# Patient Record
Sex: Male | Born: 1995 | Race: White | Hispanic: No | Marital: Married | Smoking: Never smoker
Health system: Southern US, Community
[De-identification: ages and names within clinical notes are randomized; demographics above are authoritative.]

## PROBLEM LIST (undated history)

## (undated) DIAGNOSIS — F32A Depression, unspecified: Secondary | ICD-10-CM

## (undated) DIAGNOSIS — F419 Anxiety disorder, unspecified: Secondary | ICD-10-CM

## (undated) DIAGNOSIS — F329 Major depressive disorder, single episode, unspecified: Secondary | ICD-10-CM

## (undated) HISTORY — DX: Major depressive disorder, single episode, unspecified: F32.9

## (undated) HISTORY — DX: Depression, unspecified: F32.A

## (undated) HISTORY — DX: Anxiety disorder, unspecified: F41.9

## (undated) HISTORY — PX: ELBOW CLOSED REDUCTION W/ PERCUANEOUS PINNING: SHX1492

---

## 2017-05-18 ENCOUNTER — Emergency Department (HOSPITAL_COMMUNITY): Payer: Managed Care, Other (non HMO)

## 2017-05-18 ENCOUNTER — Emergency Department (HOSPITAL_COMMUNITY)
Admission: EM | Admit: 2017-05-18 | Discharge: 2017-05-19 | Disposition: A | Discharge status: Home / Self Care | Payer: Managed Care, Other (non HMO) | Attending: Emergency Medicine | Admitting: Emergency Medicine

## 2017-05-18 ENCOUNTER — Encounter (HOSPITAL_COMMUNITY): Payer: Self-pay | Admitting: Emergency Medicine

## 2017-05-18 ENCOUNTER — Other Ambulatory Visit: Payer: Self-pay

## 2017-05-18 DIAGNOSIS — R112 Nausea with vomiting, unspecified: Secondary | ICD-10-CM | POA: Insufficient documentation

## 2017-05-18 DIAGNOSIS — M791 Myalgia, unspecified site: Secondary | ICD-10-CM | POA: Diagnosis present

## 2017-05-18 DIAGNOSIS — J111 Influenza due to unidentified influenza virus with other respiratory manifestations: Secondary | ICD-10-CM

## 2017-05-18 DIAGNOSIS — J3489 Other specified disorders of nose and nasal sinuses: Secondary | ICD-10-CM | POA: Insufficient documentation

## 2017-05-18 DIAGNOSIS — F17228 Nicotine dependence, chewing tobacco, with other nicotine-induced disorders: Secondary | ICD-10-CM | POA: Diagnosis not present

## 2017-05-18 DIAGNOSIS — R509 Fever, unspecified: Secondary | ICD-10-CM | POA: Insufficient documentation

## 2017-05-18 DIAGNOSIS — R0981 Nasal congestion: Secondary | ICD-10-CM | POA: Diagnosis not present

## 2017-05-18 DIAGNOSIS — R52 Pain, unspecified: Secondary | ICD-10-CM | POA: Diagnosis not present

## 2017-05-18 DIAGNOSIS — R69 Illness, unspecified: Secondary | ICD-10-CM

## 2017-05-18 LAB — CBC WITH DIFFERENTIAL/PLATELET
BASOS PCT: 0 %
Basophils Absolute: 0 10*3/uL (ref 0.0–0.1)
EOS ABS: 0.1 10*3/uL (ref 0.0–0.7)
EOS PCT: 0 %
HCT: 40.1 % (ref 39.0–52.0)
Hemoglobin: 13.6 g/dL (ref 13.0–17.0)
Lymphocytes Relative: 3 %
Lymphs Abs: 0.6 10*3/uL — ABNORMAL LOW (ref 0.7–4.0)
MCH: 29.3 pg (ref 26.0–34.0)
MCHC: 33.9 g/dL (ref 30.0–36.0)
MCV: 86.4 fL (ref 78.0–100.0)
MONO ABS: 1.7 10*3/uL — AB (ref 0.1–1.0)
MONOS PCT: 8 %
Neutro Abs: 18.8 10*3/uL — ABNORMAL HIGH (ref 1.7–7.7)
Neutrophils Relative %: 89 %
PLATELETS: 336 10*3/uL (ref 150–400)
RBC: 4.64 MIL/uL (ref 4.22–5.81)
RDW: 13 % (ref 11.5–15.5)
WBC: 21.3 10*3/uL — ABNORMAL HIGH (ref 4.0–10.5)

## 2017-05-18 LAB — COMPREHENSIVE METABOLIC PANEL
ALBUMIN: 4.5 g/dL (ref 3.5–5.0)
ALK PHOS: 74 U/L (ref 38–126)
ALT: 44 U/L (ref 17–63)
AST: 32 U/L (ref 15–41)
Anion gap: 13 (ref 5–15)
BILIRUBIN TOTAL: 1.3 mg/dL — AB (ref 0.3–1.2)
BUN: 9 mg/dL (ref 6–20)
CALCIUM: 9.7 mg/dL (ref 8.9–10.3)
CO2: 21 mmol/L — AB (ref 22–32)
CREATININE: 0.94 mg/dL (ref 0.61–1.24)
Chloride: 100 mmol/L — ABNORMAL LOW (ref 101–111)
GFR calc Af Amer: >60 mL/min (ref >60)
GFR calc non Af Amer: >60 mL/min (ref >60)
GLUCOSE: 98 mg/dL (ref 65–99)
Potassium: 3.7 mmol/L (ref 3.5–5.1)
SODIUM: 134 mmol/L — AB (ref 135–145)
Total Protein: 8.1 g/dL (ref 6.5–8.1)

## 2017-05-18 LAB — URINALYSIS, ROUTINE W REFLEX MICROSCOPIC
BILIRUBIN URINE: NEGATIVE
Glucose, UA: NEGATIVE mg/dL
Hgb urine dipstick: NEGATIVE
KETONES UR: 5 mg/dL — AB
Leukocytes, UA: NEGATIVE
NITRITE: NEGATIVE
PH: 7 (ref 5.0–8.0)
Protein, ur: NEGATIVE mg/dL
Specific Gravity, Urine: 1.021 (ref 1.005–1.030)

## 2017-05-18 MED ORDER — DIPHENHYDRAMINE HCL 50 MG/ML IJ SOLN
25.0000 mg | Freq: Once | INTRAMUSCULAR | Status: AC
  Administered 2017-05-18: 25 mg via INTRAVENOUS
  Filled 2017-05-18: qty 1

## 2017-05-18 MED ORDER — KETOROLAC TROMETHAMINE 30 MG/ML IJ SOLN
30.0000 mg | Freq: Once | INTRAMUSCULAR | Status: AC
  Administered 2017-05-18: 30 mg via INTRAVENOUS
  Filled 2017-05-18: qty 1

## 2017-05-18 MED ORDER — OXYMETAZOLINE HCL 0.05 % NA SOLN
1.0000 | Freq: Once | NASAL | Status: AC
  Administered 2017-05-18: 1 via NASAL
  Filled 2017-05-18: qty 15

## 2017-05-18 MED ORDER — PROCHLORPERAZINE EDISYLATE 5 MG/ML IJ SOLN
10.0000 mg | Freq: Once | INTRAMUSCULAR | Status: AC
  Administered 2017-05-18: 10 mg via INTRAVENOUS
  Filled 2017-05-18: qty 2

## 2017-05-18 MED ORDER — SODIUM CHLORIDE 0.9 % IV BOLUS (SEPSIS)
1000.0000 mL | Freq: Once | INTRAVENOUS | Status: AC
  Administered 2017-05-18: 1000 mL via INTRAVENOUS

## 2017-05-18 MED ORDER — SODIUM CHLORIDE 0.9 % IV BOLUS (SEPSIS)
1000.0000 mL | Freq: Once | INTRAVENOUS | Status: AC
  Administered 2017-05-19: 1000 mL via INTRAVENOUS

## 2017-05-18 NOTE — ED Provider Notes (Signed)
[facility] [id number] [facility] EMERGENCY DEPARTMENT Provider Note   CSN: [id number] Arrival date & time: 05/18/17  1928     History   Chief Complaint Chief Complaint  Patient presents with  . Influenza    HPI Kevin Lewis is a 22 y.o. male with no past medical history is here for evaluation of "flulike symptoms". Symptoms were sudden this morning. Symptoms include generalized, constant, sharp body pains, fevers, chills, nausea, 3 episodes of emesis with blood streaks in it, nasal congestion, clear rhinorrhea, sore throat, headache. Went to student clinic and tested negative for influenza. States some of his friends are having cold but nothing this severe. Took one Tylenol PTA which helped with fever. Has not been able to eat anything or drink anything today to see nausea and vomiting. Denies previous history of gastritis, PUD, heavy EtOH or NSAID use. No frank abdominal pain, states everything hurts. No urinary symptoms, diarrhea, constipation. No generalized rash or neck stiffness. No history of immunosuppression.  HPI  History reviewed. No pertinent past medical history.  There are no active problems to display for this patient.   Past Surgical History:  Procedure Laterality Date  . ELBOW CLOSED REDUCTION W/ PERCUANEOUS PINNING         Home Medications    Prior to Admission medications   Medication Sig Start Date End Date Taking? Authorizing Provider  ondansetron (ZOFRAN ODT) 4 MG disintegrating tablet Take 1 tablet (4 mg total) by mouth every 8 (eight) hours as needed for nausea or vomiting. 05/19/17   Liberty Handy, PA-C    Family History History reviewed. No pertinent family history.  Social History Social History   Tobacco Use  . Smoking status: Never Smoker  . Smokeless tobacco: Current User    Types: Snuff  Substance Use Topics  . Alcohol use: No    Frequency: Never  . Drug use: No     Allergies   Penicillins   Review of Systems Review of  Systems  Constitutional: Positive for chills and fever.  HENT: Positive for congestion, postnasal drip, rhinorrhea and sore throat.   Gastrointestinal: Positive for nausea and vomiting.  Musculoskeletal: Positive for myalgias.  All other systems reviewed and are negative.    Physical Exam Updated Vital Signs BP 114/65   Pulse 99   Temp 98.3 F (36.8 C) (Oral)   Resp 18   Ht 6' (1.829 m)   Wt 108.9 kg (240 lb)   SpO2 100%   BMI 32.55 kg/m   Physical Exam  Constitutional: He is oriented to person, place, and time. He appears well-developed and well-nourished. No distress.  Looks uncomfortable. Writhing in bed due to "body pains".   HENT:  Head: Normocephalic and atraumatic.  Nose: Mucosal edema and rhinorrhea present.  Mouth/Throat: Posterior oropharyngeal edema and posterior oropharyngeal erythema present. No oropharyngeal exudate. Tonsils are 2+ on the right. Tonsils are 2+ on the left.  Moderate mucosal edema with heavy clear rhinorrhea bilaterally. 2+, symmetric tonsillar hypertrophy without exudates. Oropharynx and tonsils are erythematous. Uvula is midline. No trismus.  Eyes: Conjunctivae and EOM are normal. Pupils are equal, round, and reactive to light.  Neck: Normal range of motion.  Bilateral submandibular  lymphadenopathy. Neck is supple. Trachea midline. No neck rigidity.   Cardiovascular: Normal rate, regular rhythm, normal heart sounds and intact distal pulses.  No murmur heard. 2+ DP and radial pulses bilaterally. No LE edema.   Pulmonary/Chest: Effort normal and breath sounds normal.  Abdominal: Soft. Bowel sounds are  normal. There is generalized tenderness.  Generalized, non focal abdominal tenderness patient states "not worse than these body pains". No G/R/R. No suprapubic or CVA tenderness.   Musculoskeletal: Normal range of motion. He exhibits no deformity.  Neurological: He is alert and oriented to person, place, and time.  Skin: Skin is warm and dry.  Capillary refill takes less than 2 seconds.  Psychiatric: He has a normal mood and affect. His behavior is normal. Judgment and thought content normal.  Nursing note and vitals reviewed.    ED Treatments / Results  Labs (all labs ordered are listed, but only abnormal results are displayed) Labs Reviewed  COMPREHENSIVE METABOLIC PANEL - Abnormal; Notable for the following components:      Result Value   Sodium 134 (*)    Chloride 100 (*)    CO2 21 (*)    Total Bilirubin 1.3 (*)    All other components within normal limits  CBC WITH DIFFERENTIAL/PLATELET - Abnormal; Notable for the following components:   WBC 21.3 (*)    Neutro Abs 18.8 (*)    Lymphs Abs 0.6 (*)    Monocytes Absolute 1.7 (*)    All other components within normal limits  URINALYSIS, ROUTINE W REFLEX MICROSCOPIC - Abnormal; Notable for the following components:   Ketones, ur 5 (*)    All other components within normal limits    EKG  EKG Interpretation None       Radiology Dg Chest 2 View  Result Date: 05/18/2017 CLINICAL DATA:  Fever, chills, body aches, nausea, and vomiting. EXAM: CHEST  2 VIEW COMPARISON:  None. FINDINGS: The cardiomediastinal silhouette is within normal limits. The lungs are well inflated and clear. There is no evidence of pleural effusion or pneumothorax. No acute osseous abnormality is identified. IMPRESSION: No active cardiopulmonary disease. Electronically Signed   By: Sebastian AcheAllen  Grady M.D.   On: 05/18/2017 20:23    Procedures Procedures (including critical care time)  Medications Ordered in ED Medications  sodium chloride 0.9 % bolus 1,000 mL (1,000 mLs Intravenous New Bag/Given 05/19/17 0000)  ketorolac (TORADOL) 30 MG/ML injection 30 mg (30 mg Intravenous Given 05/18/17 2355)  prochlorperazine (COMPAZINE) injection 10 mg (10 mg Intravenous Given 05/18/17 2355)  diphenhydrAMINE (BENADRYL) injection 25 mg (25 mg Intravenous Given 05/18/17 2355)  oxymetazoline (AFRIN) 0.05 % nasal spray 1  spray (1 spray Each Nare Given 05/18/17 2355)  sodium chloride 0.9 % bolus 1,000 mL (1,000 mLs Intravenous New Bag/Given 05/18/17 2355)     Initial Impression / Assessment and Plan / ED Course  I have reviewed the triage vital signs and the nursing notes.  Pertinent labs & imaging results that were available during my care of the patient were reviewed by me and considered in my medical decision making (see chart for details).  Clinical Course as of May 19 110  Fri May 18, 2017  2318 WBC: (!) 21.3 [CG]  Sat May 19, 2017  0007 Pt just administered medicines. Will reassess. Discussed benign work up with parents at bedside.   [CG]  [id number] Reevaluated patient. States he feels "much better". No episodes of emesis or diarrhea in the emergency department. Vital signs have remained within normal limits. We'll attempt fluid challenge and finish IV fluids. Anticipate discharge. He is agreeable with this.  [CG]    Clinical Course User Index [CG] Liberty HandyGibbons, Claudia J, PA-C   22 yo here for symptoms consistent with viral syndrome. Initial VS WNL. He is writing in pain, screaming from body  pains in the room. Otherwise reassuring, diffuse body pain and non focal abdominal soreness. No localized TTP to Mur[place], Mcburney's, suprapubic or CVAT areas. He has leukocytosis 21.3, but labs reassuring. No indication for further emergent lab work. Doubt intraabdominal etiology in this patient. He was given IVF and migraine cocktail. Re-evaluated pt and he felt much better. No episodes of emesis or diarrhea. Repeat abdominal exam reassuring. He is sitting up in bed and talking to friend. Will attempt PO challenge and finish IVF. Anticipate d/c.Discussed return precautions. Pt agreeable. Of note he tested negative for influenza at school clinic. He is otherwise immunocompetent and anticipate he will improve with symptom control as outpatient.   Final Clinical Impressions(s) / ED Diagnoses   Final diagnoses:  Influenza-like  illness  Body aches    ED Discharge Orders        Ordered    ondansetron (ZOFRAN ODT) 4 MG disintegrating tablet  Every 8 hours PRN     05/19/17 0057       Liberty Handy, PA-C 05/19/17 0112    Dione Booze, MD 05/19/17 [phone]

## 2017-05-18 NOTE — ED Triage Notes (Signed)
Pt having flu like symptoms since yesterday with fever, chills, generalize body ache nausea and vomiting.

## 2017-05-19 MED ORDER — ONDANSETRON 4 MG PO TBDP: 4.0000 mg | ORAL_TABLET | Freq: Three times a day (TID) | ORAL | 0 refills | Status: DC | PRN

## 2017-05-19 NOTE — Discharge Instructions (Signed)
Your symptoms are most likely from a virus. Usually symptoms are sudden but resolve over the next 24-48 hours. Her white blood cell count was elevated today but your labs and urine were otherwise normal. The main treatment for viral illnesses are supportive. Stay well-hydrated. Take Zofran for nausea. Tylenol or ibuprofen will help with generalized body pains and headaches. Over-the-counter nasal decongestions or nasal sprays also will help with nasal congestion and runny nose which can worsen headaches.

## 2017-07-10 ENCOUNTER — Encounter (HOSPITAL_COMMUNITY): Payer: Self-pay | Admitting: Emergency Medicine

## 2017-07-10 ENCOUNTER — Ambulatory Visit (INDEPENDENT_AMBULATORY_CARE_PROVIDER_SITE_OTHER): Payer: Managed Care, Other (non HMO)

## 2017-07-10 ENCOUNTER — Ambulatory Visit (HOSPITAL_COMMUNITY)
Admission: EM | Admit: 2017-07-10 | Discharge: 2017-07-10 | Disposition: A | Discharge status: Home / Self Care | Payer: Managed Care, Other (non HMO) | Attending: Family Medicine | Admitting: Family Medicine

## 2017-07-10 ENCOUNTER — Other Ambulatory Visit: Payer: Self-pay

## 2017-07-10 DIAGNOSIS — S9032XA Contusion of left foot, initial encounter: Secondary | ICD-10-CM

## 2017-07-10 DIAGNOSIS — S8002XA Contusion of left knee, initial encounter: Secondary | ICD-10-CM | POA: Diagnosis not present
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
## 2017-07-10 NOTE — Discharge Instructions (Signed)
You may use over the counter ibuprofen or acetaminophen as needed.  ° °

## 2018-05-29 ENCOUNTER — Encounter (HOSPITAL_COMMUNITY): Payer: Self-pay | Admitting: Emergency Medicine

## 2018-05-29 ENCOUNTER — Ambulatory Visit (HOSPITAL_COMMUNITY)
Admission: EM | Admit: 2018-05-29 | Discharge: 2018-05-29 | Disposition: A | Discharge status: Home / Self Care | Payer: BLUE CROSS/BLUE SHIELD | Attending: Family Medicine | Admitting: Family Medicine

## 2018-05-29 DIAGNOSIS — M5442 Lumbago with sciatica, left side: Secondary | ICD-10-CM

## 2018-05-29 MED ORDER — CYCLOBENZAPRINE HCL 10 MG PO TABS: 10.0000 mg | ORAL_TABLET | Freq: Every day | ORAL | 0 refills | Status: DC

## 2018-05-29 MED ORDER — NAPROXEN 500 MG PO TABS: 500.0000 mg | ORAL_TABLET | Freq: Two times a day (BID) | ORAL | 0 refills | Status: DC

## 2018-05-29 NOTE — ED Triage Notes (Signed)
Pt sts lower back pain with radiation down left leg

## 2018-05-29 NOTE — Discharge Instructions (Signed)
Continue conservative management of rest, ice, heat, and gentle stretches Take naproxen as needed for pain relief (may cause abdominal discomfort, ulcers, and GI bleeds avoid taking with other NSAIDs) Take cyclobenzaprine at nighttime for symptomatic relief. Avoid driving or operating heavy machinery while using medication. Follow up with PCP, [facility], or with orthopedist if symptoms persist Return or go to the ER if you have any new or worsening symptoms (fever, chills, chest pain, abdominal pain, changes in bowel or bladder habits, etc...)

## 2018-05-29 NOTE — ED Provider Notes (Signed)
[facility]ARE [facility]ENTER   [id number] 05/29/18 Arrival Time: 1600  CC: Back PAIN  SUBJECTIVE: History from: patient. Kevin Lewis is a 23 y.o. male complains of low back pain that began 1 month ago.  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  Localizes the pain to the left low back and down left leg.  Describes the pain as constant and dull/ throbbing in character.  Has tried OTC medications without relief.  Symptoms are made worse with bending forward.  Denies similar symptoms in the past.  Denies fever, chills, erythema, ecchymosis, effusion, weakness, numbness and tingling, saddle paresthesias, loss of bowel or bladder function.    ROS: As per HPI.  History reviewed. No pertinent past medical history. Past Surgical History:  Procedure Laterality Date  . ELBOW CLOSED REDUCTION W/ PERCUANEOUS PINNING     Allergies  Allergen Reactions  . Penicillins    No current facility-administered medications on file prior to encounter.    Current Outpatient Medications on File Prior to Encounter  Medication Sig Dispense Refill  . ondansetron (ZOFRAN ODT) 4 MG disintegrating tablet Take 1 tablet (4 mg total) by mouth every 8 (eight) hours as needed for nausea or vomiting. 20 tablet 0   Social History   Socioeconomic History  . Marital status: Single    Spouse name: Not on file  . Number of children: Not on file  . Years of education: Not on file  . Highest education level: Not on file  Occupational History  . Not on file  Social Needs  . Financial resource strain: Not on file  . Food insecurity:    Worry: Not on file    Inability: Not on file  . Transportation needs:    Medical: Not on file    Non-medical: Not on file  Tobacco Use  . Smoking status: Never Smoker  . Smokeless tobacco: Current User    Types: Snuff  Substance and Sexual Activity  . Alcohol use: No    Frequency: Never  . Drug use: No  . Sexual activity: Not on file  Lifestyle  . Physical activity:     Days per week: Not on file    Minutes per session: Not on file  . Stress: Not on file  Relationships  . Social connections:    Talks on phone: Not on file    Gets together: Not on file    Attends religious service: Not on file    Active member of club or organization: Not on file    Attends meetings of clubs or organizations: Not on file    Relationship status: Not on file  . Intimate partner violence:    Fear of current or ex partner: Not on file    Emotionally abused: Not on file    Physically abused: Not on file    Forced sexual activity: Not on file  Other Topics Concern  . Not on file  Social History Narrative  . Not on file   Family History  Problem Relation Age of Onset  . Diabetes Mother   . Diabetes Father     OBJECTIVE:  Vitals:   05/29/18 1634  BP: 118/65  Pulse: 85  Resp: 18  Temp: 98 F (36.7 C)  TempSrc: Oral  SpO2: 97%    General appearance: Alert; in no acute distress.  Head: NCAT Lungs: CTA bilaterally Heart: RRR.   Musculoskeletal: Back Inspection: Skin warm, dry, clear and intact without obvious erythema, effusion, or ecchymosis.  Palpation: TTP over LT low back; no midline tenderness ROM: FROM active and passive Strength: 5/5 shld abduction, 5/5 shld adduction, 5/5 elbow flexion, 5/5 elbow extension, 5/5 grip strength, 5/5 hip flexion, 5/5 knee abduction, 5/5 knee adduction, 5/5 knee flexion, 5/5 knee extension, 5/5 dorsiflexion, 5/5 plantar flexion Skin: warm and dry Neurologic: Ambulates without difficulty; Sensation intact about the upper/ lower extremities; finger to nose without difficulty; negative pronator drift Psychological: alert and cooperative; normal mood and affect  ASSESSMENT & PLAN:  1. Acute left-sided low back pain with left-sided sciatica     Meds ordered this encounter  Medications  . naproxen (NAPROSYN) 500 MG tablet    Sig: Take 1 tablet (500 mg total) by mouth 2 (two) times daily.    Dispense:  30 tablet     Refill:  0    Order Specific Question:   Supervising Provider    Answer:   Eustace Moore [[id number]]  . cyclobenzaprine (FLEXERIL) 10 MG tablet    Sig: Take 1 tablet (10 mg total) by mouth at bedtime.    Dispense:  12 tablet    Refill:  0    Order Specific Question:   Supervising Provider    Answer:   Eustace Moore [[id number]]    Continue conservative management of rest, ice, heat, and gentle stretches Take naproxen as needed for pain relief (may cause abdominal discomfort, ulcers, and GI bleeds avoid taking with other NSAIDs) Take cyclobenzaprine at nighttime for symptomatic relief. Avoid driving or operating heavy machinery while using medication. Follow up with PCP, [facility], or with orthopedist if symptoms persist Return or go to the ER if you have any new or worsening symptoms (fever, chills, chest pain, abdominal pain, changes in bowel or bladder habits, etc...)   [place] Controlled Substances Registry consulted for this patient. I feel the risk/benefit ratio today is favorable for proceeding with this prescription for a controlled substance. Medication sedation precautions given.  Reviewed expectations re: course of current medical issues. Questions answered. Outlined signs and symptoms indicating need for more acute intervention. Patient verbalized understanding. After Visit Summary given.    Rennis Harding, PA-C 05/29/18 1709

## 2018-06-02 ENCOUNTER — Encounter (HOSPITAL_COMMUNITY): Payer: Self-pay

## 2018-06-02 ENCOUNTER — Ambulatory Visit (HOSPITAL_COMMUNITY)
Admission: EM | Admit: 2018-06-02 | Discharge: 2018-06-02 | Disposition: A | Discharge status: Home / Self Care | Payer: BLUE CROSS/BLUE SHIELD | Attending: Family Medicine | Admitting: Family Medicine

## 2018-06-02 ENCOUNTER — Ambulatory Visit (INDEPENDENT_AMBULATORY_CARE_PROVIDER_SITE_OTHER): Payer: BLUE CROSS/BLUE SHIELD

## 2018-06-02 ENCOUNTER — Other Ambulatory Visit: Payer: Self-pay

## 2018-06-02 DIAGNOSIS — M5442 Lumbago with sciatica, left side: Secondary | ICD-10-CM

## 2018-06-02 DIAGNOSIS — M545 Low back pain: Secondary | ICD-10-CM

## 2018-06-02 MED ORDER — HYDROCODONE-ACETAMINOPHEN 7.5-325 MG PO TABS: 1.0000 | ORAL_TABLET | ORAL | 0 refills | Status: DC | PRN

## 2018-06-02 MED ORDER — METHYLPREDNISOLONE 4 MG PO TBPK: ORAL_TABLET | ORAL | 0 refills | Status: DC

## 2018-06-02 MED ORDER — CITALOPRAM HYDROBROMIDE 20 MG PO TABS: 20.0000 mg | ORAL_TABLET | Freq: Every day | ORAL | 1 refills | Status: DC

## 2018-06-02 NOTE — ED Provider Notes (Signed)
[facility]    CSN: [id number] Arrival date & time: 06/02/18  1258     History   Chief Complaint Chief Complaint  Patient presents with  . Leg Pain    HPI Kevin Lewis is a 23 y.o. male.   HPI  Patient is here for back pain.  He states he has had gradual onset of back pain for about a month.  For the first week it was just an achiness in his low back.  Then he developed sudden pain down his left leg.  The pain is getting worse.  He states sometimes when he puts weight on the leg it gives way secondary to pain.  No real numbness or weakness.  No bowel or bladder complaints.  No injury.  No known spine condition.  No prior back history.  He thinks it is from his work activity but does not have a specific work injury. He was seen last week.  Exam was consistent with left sciatica.  He was given naproxen 500 twice daily and Flexeril 10 mg.  He states he is unable to work.  He states these have not given him any improvement in his pain.  History reviewed. No pertinent past medical history.  There are no active problems to display for this patient.   Past Surgical History:  Procedure Laterality Date  . ELBOW CLOSED REDUCTION W/ PERCUANEOUS PINNING         Home Medications    Prior to Admission medications   Medication Sig Start Date End Date Taking? Authorizing Provider  citalopram (CELEXA) 20 MG tablet Take 1 tablet (20 mg total) by mouth daily. 06/02/18   Eustace Moore, MD  cyclobenzaprine (FLEXERIL) 10 MG tablet Take 1 tablet (10 mg total) by mouth at bedtime. 05/29/18   Wurst, [place], PA-C  HYDROcodone-acetaminophen (NORCO) 7.5-325 MG tablet Take 1 tablet by mouth every 4 (four) hours as needed for moderate pain. 06/02/18   Eustace Moore, MD  methylPREDNISolone (MEDROL DOSEPAK) 4 MG TBPK tablet tad 06/02/18   Eustace Moore, MD  naproxen (NAPROSYN) 500 MG tablet Take 1 tablet (500 mg total) by mouth 2 (two) times daily. 05/29/18   Wurst, [place], PA-C    ondansetron (ZOFRAN ODT) 4 MG disintegrating tablet Take 1 tablet (4 mg total) by mouth every 8 (eight) hours as needed for nausea or vomiting. 05/19/17   Liberty Handy, PA-C    Family History Family History  Problem Relation Age of Onset  . Diabetes Mother   . Diabetes Father   Mother had spine surgery in her 50s  Social History Social History   Tobacco Use  . Smoking status: Never Smoker  . Smokeless tobacco: Current User    Types: Snuff  Substance Use Topics  . Alcohol use: No    Frequency: Never  . Drug use: No     Allergies   Penicillins   Review of Systems Review of Systems  Constitutional: Negative for chills and fever.  HENT: Negative for ear pain and sore throat.   Eyes: Negative for pain and visual disturbance.  Respiratory: Negative for cough and shortness of breath.   Cardiovascular: Negative for chest pain and palpitations.  Gastrointestinal: Negative for abdominal pain and vomiting.  Genitourinary: Negative for dysuria and hematuria.  Musculoskeletal: Positive for back pain and gait problem. Negative for arthralgias.  Skin: Negative for color change and rash.  Neurological: Negative for seizures and syncope.  All other systems reviewed and are negative.  Physical Exam Triage Vital Signs ED Triage Vitals  Enc Vitals Group     BP 06/02/18 1313 124/74     Pulse Rate 06/02/18 1313 74     Resp 06/02/18 1313 18     Temp 06/02/18 1313 97.6 F (36.4 C)     Temp Source 06/02/18 1313 Tympanic     SpO2 06/02/18 1313 100 %     Weight 06/02/18 1315 240 lb (108.9 kg)     Height --      Head Circumference --      Peak Flow --      Pain Score 06/02/18 1315 10     Pain Loc --      Pain Edu? --      Excl. in GC? --    No data found.  Updated Vital Signs BP 124/74 (BP Location: Right Arm)   Pulse 74   Temp 97.6 F (36.4 C) (Tympanic)   Resp 18   Wt 108.9 kg   SpO2 100%   BMI 32.55 kg/m      Physical Exam Constitutional:      General:  He is not in acute distress.    Appearance: He is well-developed. He is obese.     Comments: Overweight.  Appears acutely uncomfortable.  Paces around room  HENT:     Head: Normocephalic and atraumatic.  Eyes:     Conjunctiva/sclera: Conjunctivae normal.     Pupils: Pupils are equal, round, and reactive to light.  Neck:     Musculoskeletal: Normal range of motion.  Cardiovascular:     Rate and Rhythm: Normal rate.  Pulmonary:     Effort: Pulmonary effort is normal. No respiratory distress.  Abdominal:     General: There is no distension.     Palpations: Abdomen is soft.  Musculoskeletal: Normal range of motion.     Comments: Back is straight and symmetric.  Mild tenderness centrally L5-S1 junction.  No palpable muscle spasm.  Neck range of motion is full.  No pain with axial loading.  No pain with passive rotation.  Very limited range of motion with patient however only to forward flex only 15 degrees.  Reflexes are 2+ and equal at the knee.  Reflexes 2+ right ankle, 1+ left ankle.  No weakness.  Can stand on heels and toes without difficulty.  No numbness noted.  Straight leg raise is positive for left leg pain at 30 degrees on the left, and positive for left leg and buttock pain at 45 degrees on the right  Skin:    General: Skin is warm and dry.  Neurological:     Mental Status: He is alert.     Sensory: No sensory deficit.     Motor: No weakness.     Gait: Gait normal.     Deep Tendon Reflexes: Reflexes abnormal.  Psychiatric:        Mood and Affect: Mood normal.        Behavior: Behavior normal.      UC Treatments / Results  Labs (all labs ordered are listed, but only abnormal results are displayed) Labs Reviewed - No data to display  EKG None  Radiology Dg Lumbar Spine Complete  Result Date: 06/02/2018 CLINICAL DATA:  Acute low back pain radiating into LEFT leg. No known injury. Initial encounter. EXAM: LUMBAR SPINE - COMPLETE 4+ VIEW COMPARISON:  None. FINDINGS:  There is no evidence of lumbar spine fracture. Alignment is normal. Intervertebral disc spaces are maintained. IMPRESSION:  Negative. Electronically Signed   By: Harmon Pier M.D.   On: 06/02/2018 14:35    Procedures Procedures (including critical care time)  Medications Ordered in UC Medications - No data to display  Initial Impression / Assessment and Plan / UC Course  I have reviewed the triage vital signs and the nursing notes.  Pertinent labs & imaging results that were available during my care of the patient were reviewed by me and considered in my medical decision making (see chart for details).     On physical exam there are no indicators of symptom magnification.  He does have positive straight leg raising bilaterally which is suspicious for disc disease/neural impingement.  X-rays are done because of the neurologic finding of diminished reflex on the left.  He will possibly need referral to a spine specialist.  I have given him the number for PCP as well.  The PCP can keep him on his nonsteroidal medicine, anti-inflammatories, and perhaps engage him in physical therapy. Final Clinical Impressions(s) / UC Diagnoses   Final diagnoses:  Acute left-sided low back pain with left-sided sciatica     Discharge Instructions     Activity as tolerated Take the Medrol (steroid) as directed Take all of day 1 today Take the cyclobenzaprine muscle relaxer at bedtime Take hydrocodone as needed for severe pain.  Caution drowsiness.  Do not drive on this medication.  This medicine can cause constipation, make sure you are drinking enough fluids and eating a high-fiber diet You need to follow-up with your primary care doctor or a spine specialist for additional testing    ED Prescriptions    Medication Sig Dispense Auth. Provider   methylPREDNISolone (MEDROL DOSEPAK) 4 MG TBPK tablet tad 21 tablet Eustace Moore, MD   HYDROcodone-acetaminophen (NORCO) 7.5-325 MG tablet Take 1 tablet by  mouth every 4 (four) hours as needed for moderate pain. 20 tablet Eustace Moore, MD   citalopram (CELEXA) 20 MG tablet Take 1 tablet (20 mg total) by mouth daily. 30 tablet Eustace Moore, MD     Controlled Substance Prescriptions [place] Controlled Substance Registry consulted? Not Applicable   Eustace Moore, MD 06/02/18 [phone]

## 2018-06-02 NOTE — Discharge Instructions (Signed)
Activity as tolerated Take the Medrol (steroid) as directed Take all of day 1 today Take the cyclobenzaprine muscle relaxer at bedtime Take hydrocodone as needed for severe pain.  Caution drowsiness.  Do not drive on this medication.  This medicine can cause constipation, make sure you are drinking enough fluids and eating a high-fiber diet You need to follow-up with your primary care doctor or a spine specialist for additional testing

## 2018-06-02 NOTE — ED Triage Notes (Signed)
Pt was here for left leg pain on Wednseday of this week. Pt states the pain is getting worst. Pt states the meds are not working.

## 2018-06-13 DIAGNOSIS — M5416 Radiculopathy, lumbar region: Secondary | ICD-10-CM | POA: Diagnosis not present

## 2018-06-20 DIAGNOSIS — M5416 Radiculopathy, lumbar region: Secondary | ICD-10-CM | POA: Diagnosis not present

## 2018-06-20 DIAGNOSIS — M545 Low back pain: Secondary | ICD-10-CM | POA: Diagnosis not present

## 2018-06-26 ENCOUNTER — Ambulatory Visit (INDEPENDENT_AMBULATORY_CARE_PROVIDER_SITE_OTHER): Payer: BLUE CROSS/BLUE SHIELD | Admitting: Family Medicine

## 2018-06-26 ENCOUNTER — Ambulatory Visit: Payer: BLUE CROSS/BLUE SHIELD | Admitting: Family Medicine

## 2018-06-26 ENCOUNTER — Encounter: Payer: Self-pay | Admitting: Family Medicine

## 2018-06-26 DIAGNOSIS — F325 Major depressive disorder, single episode, in full remission: Secondary | ICD-10-CM | POA: Insufficient documentation

## 2018-06-26 DIAGNOSIS — Z7689 Persons encountering health services in other specified circumstances: Secondary | ICD-10-CM | POA: Diagnosis not present

## 2018-06-26 MED ORDER — CITALOPRAM HYDROBROMIDE 20 MG PO TABS: 20.0000 mg | ORAL_TABLET | Freq: Every day | ORAL | 3 refills | Status: DC

## 2018-06-26 NOTE — Progress Notes (Signed)
Virtual Visit via Video Note  I connected with [name] on 06/26/18 at  4:00 PM EDT by a video enabled telemedicine application and verified that I am speaking with the correct person using two identifiers.   I discussed the limitations of evaluation and management by telemedicine and the availability of in person appointments. The patient expressed understanding and agreed to proceed.  History of Present Illness: Kevin Lewis is a 23 y.o. male looking to establish care with our office and to discuss antidepressant medication. He is a Consulting civil engineer at [employer]. He was seen at student health in 02/2018 and was started on celexa 20mg  daily. He saw a therapist "a few times" but wasn't sure it was helpful. This has ended as school is not currently in session due to COVID-19 pandemic. Pt states his symptoms and mood have improved on the medication. He denies any side effects. He would like to continue taking it and needs a refill. Denies SI and no h/o suicide attempts. He has not been treated for depression previously and has not previously taken anti-depressant medication. + fam h/o depression - both parents PHQ-9 = 4 GAD-7 = 6 No insomnia. Mild anxiety. Normal appetite.  No fever, chills. No GI symptoms. No CP, SOB, palpitations  Past Medical History:  Diagnosis Date  . Anxiety   . Depression     Past Surgical History:  Procedure Laterality Date  . ELBOW CLOSED REDUCTION W/ PERCUANEOUS PINNING     Social History   Socioeconomic History  . Marital status: Single    Spouse name: Not on file  . Number of children: Not on file  . Years of education: Not on file  . Highest education level: Not on file  Occupational History  . Not on file  Social Needs  . Financial resource strain: Not on file  . Food insecurity:    Worry: Not on file    Inability: Not on file  . Transportation needs:    Medical: Not on file    Non-medical: Not on file  Tobacco Use  . Smoking status: Never Smoker  .  Smokeless tobacco: Current User    Types: Snuff  Substance and Sexual Activity  . Alcohol use: No    Frequency: Never  . Drug use: No  . Sexual activity: Not on file  Lifestyle  . Physical activity:    Days per week: Not on file    Minutes per session: Not on file  . Stress: Not on file  Relationships  . Social connections:    Talks on phone: Not on file    Gets together: Not on file    Attends religious service: Not on file    Active member of club or organization: Not on file    Attends meetings of clubs or organizations: Not on file    Relationship status: Not on file  . Intimate partner violence:    Fear of current or ex partner: Not on file    Emotionally abused: Not on file    Physically abused: Not on file    Forced sexual activity: Not on file  Other Topics Concern  . Not on file  Social History Narrative  . Not on file   Family History  Problem Relation Age of Onset  . Diabetes Mother   . Diabetes Father    Current Outpatient Medications on File Prior to Visit  Medication Sig Dispense Refill  . citalopram (CELEXA) 20 MG tablet Take 1 tablet (20  mg total) by mouth daily. 30 tablet 1   No current facility-administered medications on file prior to visit.    Immunization History  Administered Date(s) Administered  . Influenza,inj,Quad PF,6+ Mos 01/11/2017, 12/25/2017   Allergies  Allergen Reactions  . Penicillins      Observations/Objective: Pt is alert, in no distress. He is well-developed.  Pt answers questions appropriately and mood is appropriate.   Assessment and Plan: 1. Encounter to establish care with new doctor 2. Depression, major, single episode, complete remission (HCC) - stable, very well-controlled - PHQ-9 = 4 Refill: - citalopram (CELEXA) 20 MG tablet; Take 1 tablet (20 mg total) by mouth daily.  Dispense: 90 tablet; Refill: 3 - f/u in 6-12 mo or sooner PRN    I discussed the assessment and treatment plan with the patient. The  patient was provided an opportunity to ask questions and all were answered. The patient agreed with the plan and demonstrated an understanding of the instructions.   The patient was advised to call back or seek an in-person evaluation if the symptoms worsen or if the condition fails to improve as anticipated.  I provided 20 minutes of non-face-to-face time during this encounter.   Luana Shu, DO

## 2018-07-09 DIAGNOSIS — M5126 Other intervertebral disc displacement, lumbar region: Secondary | ICD-10-CM | POA: Diagnosis not present

## 2018-11-18 DIAGNOSIS — Z20828 Contact with and (suspected) exposure to other viral communicable diseases: Secondary | ICD-10-CM | POA: Diagnosis not present

## 2018-12-22 DIAGNOSIS — R05 Cough: Secondary | ICD-10-CM | POA: Diagnosis not present

## 2019-01-08 DIAGNOSIS — J988 Other specified respiratory disorders: Secondary | ICD-10-CM | POA: Diagnosis not present

## 2019-03-11 DIAGNOSIS — Z03818 Encounter for observation for suspected exposure to other biological agents ruled out: Secondary | ICD-10-CM | POA: Diagnosis not present

## 2019-06-13 ENCOUNTER — Ambulatory Visit: Payer: BC Managed Care – PPO | Attending: Internal Medicine

## 2019-06-13 DIAGNOSIS — Z23 Encounter for immunization: Secondary | ICD-10-CM

## 2019-06-13 NOTE — Progress Notes (Signed)
   Covid-19 Vaccination Clinic  Name:  Kevin Lewis    MRN: [id number] DOB: 05/24/1995  06/13/2019  [name] was observed post Covid-19 immunization for 15 minutes without incident. He was provided with Vaccine Information Sheet and instruction to access the V-Safe system.   [name] was instructed to call 911 with any severe reactions post vaccine: Marland Kitchen Difficulty breathing  . Swelling of face and throat  . A fast heartbeat  . A bad rash all over body  . Dizziness and weakness   Immunizations Administered    Name Date Dose VIS Date Route   Pfizer COVID-19 Vaccine 06/13/2019  1:59 PM 0.3 mL 03/14/2019 Intramuscular   Manufacturer: [employer], [employer]   Lot: [id number]   NDC: [address]-7

## 2019-07-07 ENCOUNTER — Other Ambulatory Visit: Payer: Self-pay | Admitting: Family Medicine

## 2019-07-07 DIAGNOSIS — F325 Major depressive disorder, single episode, in full remission: Secondary | ICD-10-CM

## 2019-07-08 ENCOUNTER — Ambulatory Visit: Payer: BC Managed Care – PPO | Attending: Internal Medicine

## 2019-07-08 DIAGNOSIS — Z23 Encounter for immunization: Secondary | ICD-10-CM

## 2019-07-08 NOTE — Progress Notes (Signed)
   Covid-19 Vaccination Clinic  Name:  Kevin Lewis    MRN: [id number] DOB: Feb 05, 1996  07/08/2019  [name] was observed post Covid-19 immunization for 15 minutes without incident. He was provided with Vaccine Information Sheet and instruction to access the V-Safe system.   [name] was instructed to call 911 with any severe reactions post vaccine: Marland Kitchen Difficulty breathing  . Swelling of face and throat  . A fast heartbeat  . A bad rash all over body  . Dizziness and weakness   Immunizations Administered    Name Date Dose VIS Date Route   Pfizer COVID-19 Vaccine 07/08/2019  9:23 AM 0.3 mL 03/14/2019 Intramuscular   Manufacturer: [employer], [employer]   Lot: [id number]   NDC: [address]-3

## 2019-08-18 ENCOUNTER — Ambulatory Visit (HOSPITAL_COMMUNITY)
Admission: EM | Admit: 2019-08-18 | Discharge: 2019-08-18 | Disposition: A | Discharge status: Home / Self Care | Payer: BC Managed Care – PPO | Attending: Urgent Care | Admitting: Urgent Care

## 2019-08-18 ENCOUNTER — Other Ambulatory Visit: Payer: Self-pay

## 2019-08-18 ENCOUNTER — Encounter (HOSPITAL_COMMUNITY): Payer: Self-pay

## 2019-08-18 DIAGNOSIS — M7918 Myalgia, other site: Secondary | ICD-10-CM

## 2019-08-18 DIAGNOSIS — L03317 Cellulitis of buttock: Secondary | ICD-10-CM | POA: Diagnosis not present

## 2019-08-18 MED ORDER — MELOXICAM 15 MG PO TABS: 15.0000 mg | ORAL_TABLET | Freq: Every day | ORAL | 0 refills | Status: DC

## 2019-08-18 MED ORDER — DOXYCYCLINE HYCLATE 100 MG PO CAPS: 100.0000 mg | ORAL_CAPSULE | Freq: Two times a day (BID) | ORAL | 0 refills | Status: DC

## 2019-08-18 NOTE — ED Provider Notes (Signed)
[place]   MRN: [id number] DOB: 1995/12/12  Subjective:   Kevin Lewis is a 24 y.o. male presenting for 3 day hx of acute onset buttock pain right at the top of his tailbone.  Pain has worsened and he is having a difficult time sitting.  Has not used medications for pain relief.  Denies history of abscess, pilonidal cyst.  Denies fever, nausea, vomiting, belly pain, painful urination, hematuria.  No current facility-administered medications for this encounter.  Current Outpatient Medications:  .  citalopram (CELEXA) 20 MG tablet, Take 1qd (Plz sched appt for future fills), Disp: 90 tablet, Rfl: 0   Allergies  Allergen Reactions  . Penicillins     Past Medical History:  Diagnosis Date  . Anxiety   . Depression      Past Surgical History:  Procedure Laterality Date  . ELBOW CLOSED REDUCTION W/ PERCUANEOUS PINNING      Family History  Problem Relation Age of Onset  . Diabetes Mother   . Diabetes Father     Social History   Tobacco Use  . Smoking status: Never Smoker  . Smokeless tobacco: Current User    Types: Snuff  Substance Use Topics  . Alcohol use: Yes  . Drug use: No    ROS   Objective:   Vitals: BP 115/69 (BP Location: Right Arm)   Pulse 81   Temp 98.7 F (37.1 C) (Oral)   Resp 19   Wt 250 lb (113.4 kg)   SpO2 93%   BMI 33.91 kg/m   Physical Exam Constitutional:      General: He is not in acute distress.    Appearance: Normal appearance. He is well-developed. He is obese. He is not ill-appearing, toxic-appearing or diaphoretic.  HENT:     Head: Normocephalic and atraumatic.     Right Ear: External ear normal.     Left Ear: External ear normal.     Nose: Nose normal.     Mouth/Throat:     Pharynx: Oropharynx is clear.  Eyes:     General: No scleral icterus.       Right eye: No discharge.        Left eye: No discharge.     Extraocular Movements: Extraocular movements intact.     Pupils: Pupils are equal, round, and reactive  to light.  Cardiovascular:     Rate and Rhythm: Normal rate.  Pulmonary:     Effort: Pulmonary effort is normal.  Musculoskeletal:     Cervical back: Normal range of motion.  Skin:      Neurological:     Mental Status: He is alert and oriented to person, place, and time.  Psychiatric:        Mood and Affect: Mood normal.        Behavior: Behavior normal.        Thought Content: Thought content normal.        Judgment: Judgment normal.     Assessment and Plan :   PDMP not reviewed this encounter.  1. Cellulitis of buttock   2. Buttock pain     Will manage for cellulitis given that there was no central fluctuance.  Wound not amenable to I&D as such, patient also did not want I&D unless I was sure it was an abscess. Use meloxicam for pain and inflammation, doxycycline to address infectious process. Recommended 2 day window for f/u if sx persist or definitely if they worsen for recheck to see if an area  amenable to I&D develops. Counseled patient on potential for adverse effects with medications prescribed/recommended today, ER and return-to-clinic precautions discussed, patient verbalized understanding.    Wallis Bamberg, [place] 08/18/19 1657

## 2019-08-18 NOTE — ED Triage Notes (Signed)
Pt is here with an abscess on his tailbone that he notice Saturday. Pt has taken Advil to relieve discomfort.

## 2019-08-18 NOTE — Discharge Instructions (Signed)
If your symptoms persist or worsen then do not hesitate to come back as you may end up developing an abscess that we can lance.

## 2019-10-09 ENCOUNTER — Other Ambulatory Visit: Payer: Self-pay | Admitting: Family Medicine

## 2019-10-09 DIAGNOSIS — F325 Major depressive disorder, single episode, in full remission: Secondary | ICD-10-CM

## 2019-10-10 NOTE — Telephone Encounter (Signed)
Will refill at pts appt

## 2019-10-10 NOTE — Telephone Encounter (Signed)
Patient's pcp was removed from patients chart. I spoke with patient he stated he wanted to keep Dr. Salena Saner as his pcp, and he needed refills on his Celexa.  I let patient know he had not been seen in over a year and in order to have refills he needed to schedule an appointment. Patient scheduled with pcp for 10/22/19 for medication follow up and refills. Dr. Salena Saner do you want to refill the celexa or wait until the visit he stated he has enough for about a month  Please advise

## 2019-10-22 ENCOUNTER — Encounter: Payer: Self-pay | Admitting: Family Medicine

## 2019-10-22 ENCOUNTER — Other Ambulatory Visit: Payer: Self-pay

## 2019-10-22 ENCOUNTER — Ambulatory Visit: Payer: BC Managed Care – PPO | Admitting: Family Medicine

## 2019-10-22 VITALS — BP 130/90 | HR 67 | Temp 97.9°F | Ht 72.0 in | Wt 258.2 lb

## 2019-10-22 DIAGNOSIS — F419 Anxiety disorder, unspecified: Secondary | ICD-10-CM | POA: Diagnosis not present

## 2019-10-22 DIAGNOSIS — F321 Major depressive disorder, single episode, moderate: Secondary | ICD-10-CM

## 2019-10-22 MED ORDER — CITALOPRAM HYDROBROMIDE 20 MG PO TABS: 20.0000 mg | ORAL_TABLET | Freq: Every day | ORAL | 3 refills | Status: DC

## 2019-10-22 NOTE — Progress Notes (Signed)
Kevin Lewis is a 24 y.o. male  Chief Complaint  Patient presents with  . Medication Refill    Citalopram    HPI: Kevin Lewis is a 24 y.o. male here for f/u on depression and anxiety, and a refill of his citalopram 20mg  daily. He feels the med is effective. He feels it is "working well" No side effects.   He is no longer at [employer], working full time at [employer].  Depression screen [facility] 2/9 10/22/2019 06/26/2018  Decreased Interest 1 0  Down, Depressed, Hopeless 1 0  PHQ - 2 Score 2 0  Altered sleeping 2 1  Tired, decreased energy 3 2  Change in appetite 2 0  Feeling bad or failure about yourself  1 1  Trouble concentrating 2 0  Moving slowly or fidgety/restless 0 0  Suicidal thoughts 1 0  PHQ-9 Score 13 4  Difficult doing work/chores Somewhat difficult -   GAD 7 : Generalized Anxiety Score 10/22/2019 06/26/2018  Nervous, Anxious, on Edge 1 0  Control/stop worrying 0 1  Worry too much - different things 0 1  Trouble relaxing 2 2  Restless 1 0  Easily annoyed or irritable 2 1  Afraid - awful might happen 1 1  Total GAD 7 Score 7 6  Anxiety Difficulty Somewhat difficult -    Past Medical History:  Diagnosis Date  . Anxiety   . Depression     Past Surgical History:  Procedure Laterality Date  . ELBOW CLOSED REDUCTION W/ PERCUANEOUS PINNING      Social History   Socioeconomic History  . Marital status: Single    Spouse name: Not on file  . Number of children: Not on file  . Years of education: Not on file  . Highest education level: Not on file  Occupational History  . Not on file  Tobacco Use  . Smoking status: Never Smoker  . Smokeless tobacco: Current User    Types: Snuff  Substance and Sexual Activity  . Alcohol use: Yes  . Drug use: No  . Sexual activity: Not Currently    Birth control/protection: None  Other Topics Concern  . Not on file  Social History Narrative  . Not on file   Social Determinants of Health   Financial Resource Strain:   .  Difficulty of Paying Living Expenses:   Food Insecurity:   . Worried About 06/28/2018 in the Last Year:   . Programme researcher, broadcasting/film/video in the Last Year:   Transportation Needs:   . Barista (Medical):   Freight forwarder Lack of Transportation (Non-Medical):   Physical Activity:   . Days of Exercise per Week:   . Minutes of Exercise per Session:   Stress:   . Feeling of Stress :   Social Connections:   . Frequency of Communication with Friends and Family:   . Frequency of Social Gatherings with Friends and Family:   . Attends Religious Services:   . Active Member of Clubs or Organizations:   . Attends Marland Kitchen Meetings:   Banker Marital Status:   Intimate Partner Violence:   . Fear of Current or Ex-Partner:   . Emotionally Abused:   Marland Kitchen Physically Abused:   . Sexually Abused:     Family History  Problem Relation Age of Onset  . Diabetes Mother   . Diabetes Father      Immunization History  Administered Date(s) Administered  . Influenza,inj,Quad PF,6+ Mos 01/11/2017, 12/25/2017  .  PFIZER SARS-COV-2 Vaccination 06/13/2019, 07/08/2019    Outpatient Encounter Medications as of 10/22/2019  Medication Sig  . citalopram (CELEXA) 20 MG tablet Take 1qd (Plz sched appt for future fills)  . meloxicam (MOBIC) 15 MG tablet Take 1 tablet (15 mg total) by mouth daily.  Marland Kitchen doxycycline (VIBRAMYCIN) 100 MG capsule Take 1 capsule (100 mg total) by mouth 2 (two) times daily. (Patient not taking: Reported on 10/22/2019)   No facility-administered encounter medications on file as of 10/22/2019.     ROS: Pertinent positives and negatives noted in HPI. Remainder of ROS non-contributory    Allergies  Allergen Reactions  . Penicillins     BP 130/90 (BP Location: Left Arm, Patient Position: Sitting, Cuff Size: Normal)   Pulse 67   Temp 97.9 F (36.6 C) (Temporal)   Ht 6' (1.829 m)   Wt 258 lb 3.2 oz (117.1 kg)   SpO2 98%   BMI 35.02 kg/m   Physical Exam Constitutional:       General: He is not in acute distress.    Appearance: Normal appearance. He is not ill-appearing.  Cardiovascular:     Rate and Rhythm: Normal rate and regular rhythm.     Pulses: Normal pulses.     Heart sounds: Normal heart sounds.  Pulmonary:     Effort: Pulmonary effort is normal. No respiratory distress.  Neurological:     General: No focal deficit present.     Mental Status: He is alert and oriented to person, place, and time.  Psychiatric:        Mood and Affect: Mood normal.        Behavior: Behavior normal.      A/P:  1. Moderate major depression (HCC) 2. Anxiety - pt feels med is effective, denies side effects Refill: - citalopram (CELEXA) 20 MG tablet; Take 1 tablet (20 mg total) by mouth daily.  Dispense: 90 tablet; Refill: 3 - f/u in 1 year or sooner PRN Discussed plan and reviewed medications with patient, including risks, benefits, and potential side effects. Pt expressed understand. All questions answered.   This visit occurred during the SARS-CoV-2 public health emergency.  Safety protocols were in place, including screening questions prior to the visit, additional usage of staff PPE, and extensive cleaning of exam room while observing appropriate contact time as indicated for disinfecting solutions.

## 2019-12-30 DIAGNOSIS — Z20828 Contact with and (suspected) exposure to other viral communicable diseases: Secondary | ICD-10-CM | POA: Diagnosis not present

## 2020-01-04 DIAGNOSIS — Z20822 Contact with and (suspected) exposure to covid-19: Secondary | ICD-10-CM | POA: Diagnosis not present

## 2020-01-24 DIAGNOSIS — Z20822 Contact with and (suspected) exposure to covid-19: Secondary | ICD-10-CM | POA: Diagnosis not present

## 2020-04-14 IMAGING — DX DG LUMBAR SPINE COMPLETE 4+V
5 series · 5 of 5 positions shown · non-contrast
Comparison: None.

CLINICAL DATA: Acute low back pain radiating into LEFT leg. No
known injury. Initial encounter.

EXAM:
LUMBAR SPINE - COMPLETE 4+ VIEW

[l-spine ap]
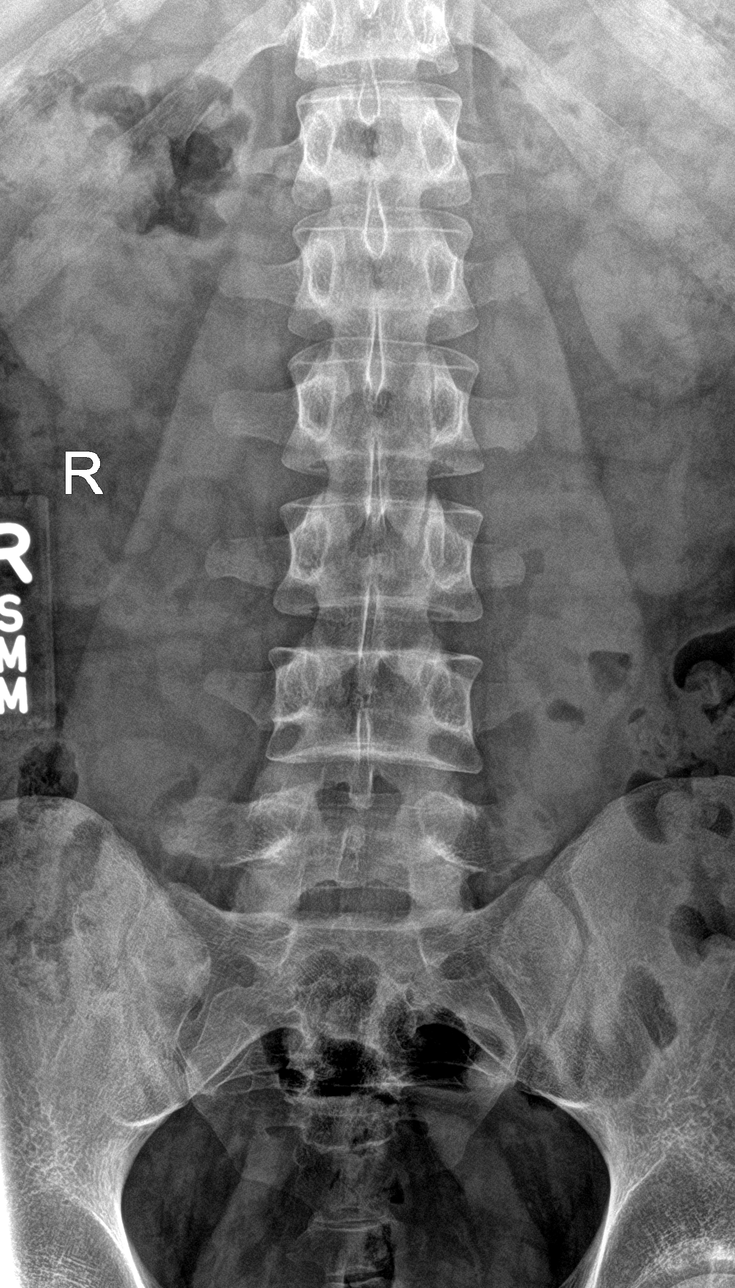

[l-spine obl (1 of 2)]
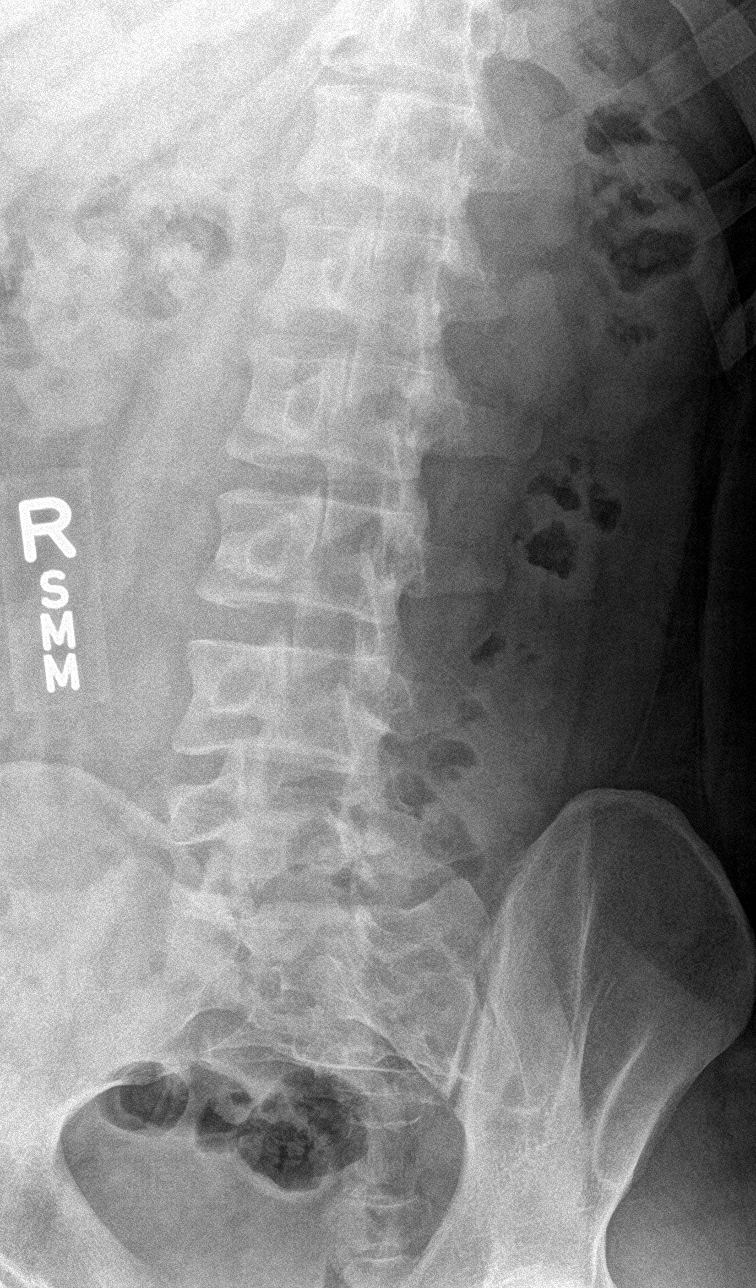

[l-spine obl (2 of 2)]
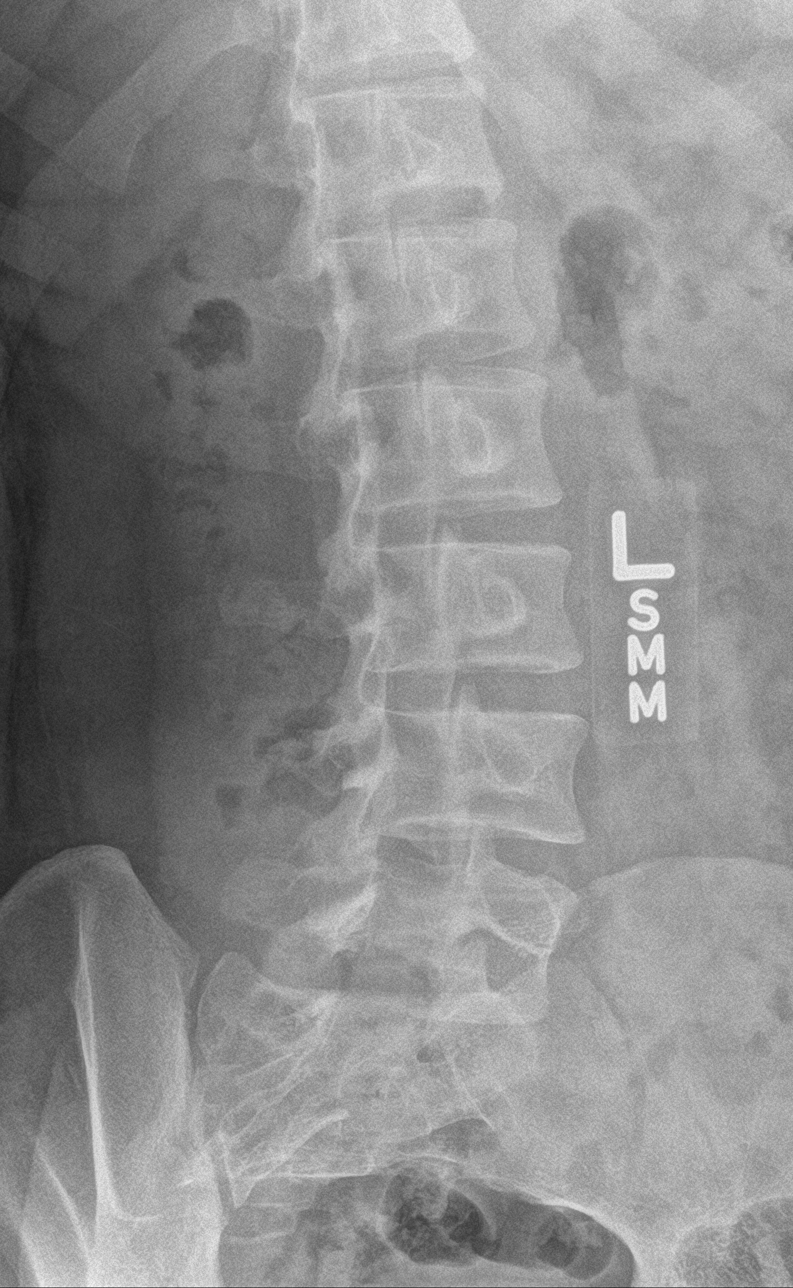

[l-spine lat]
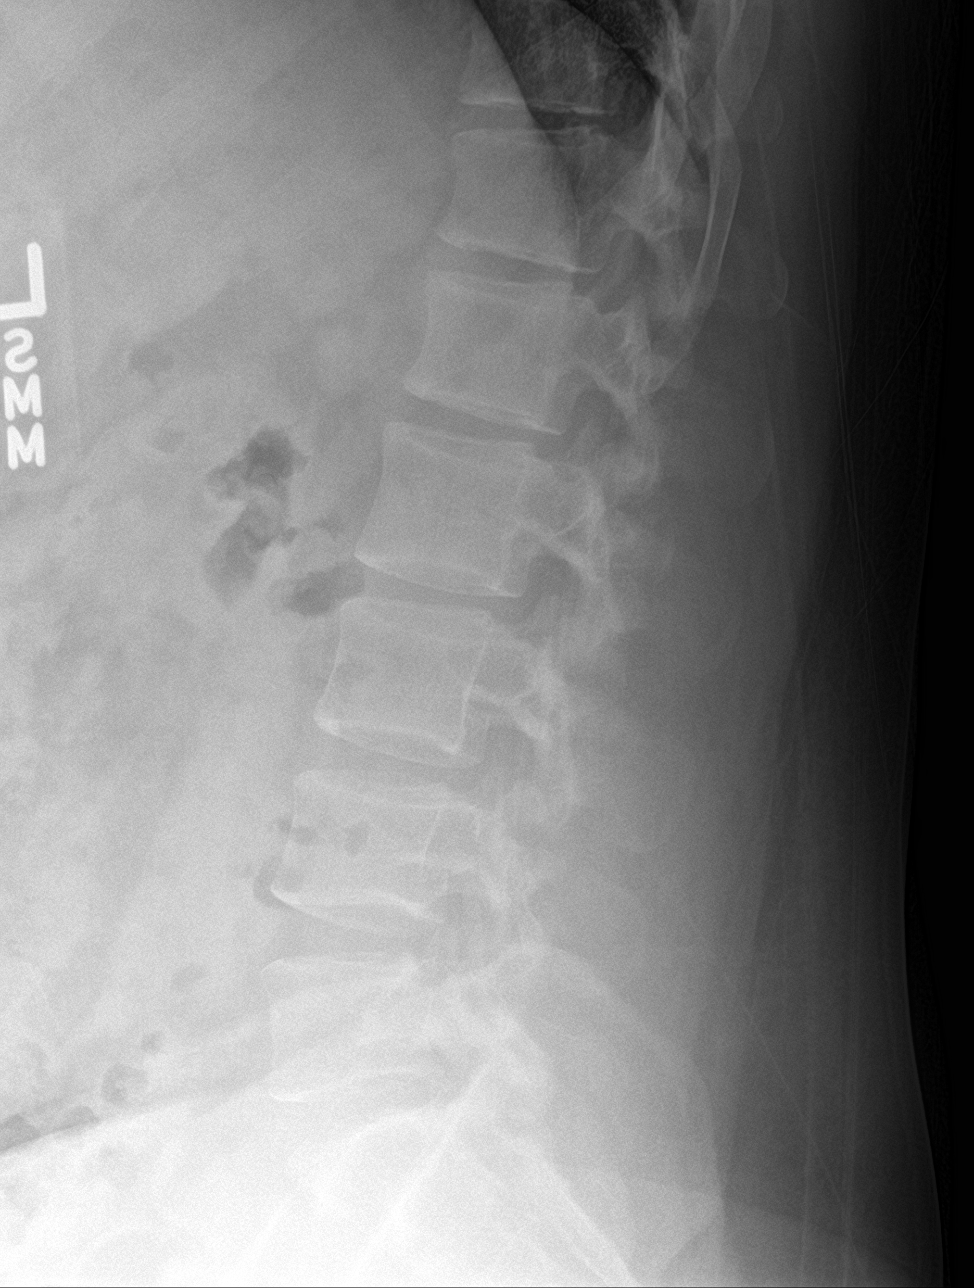

[l-spine spot]
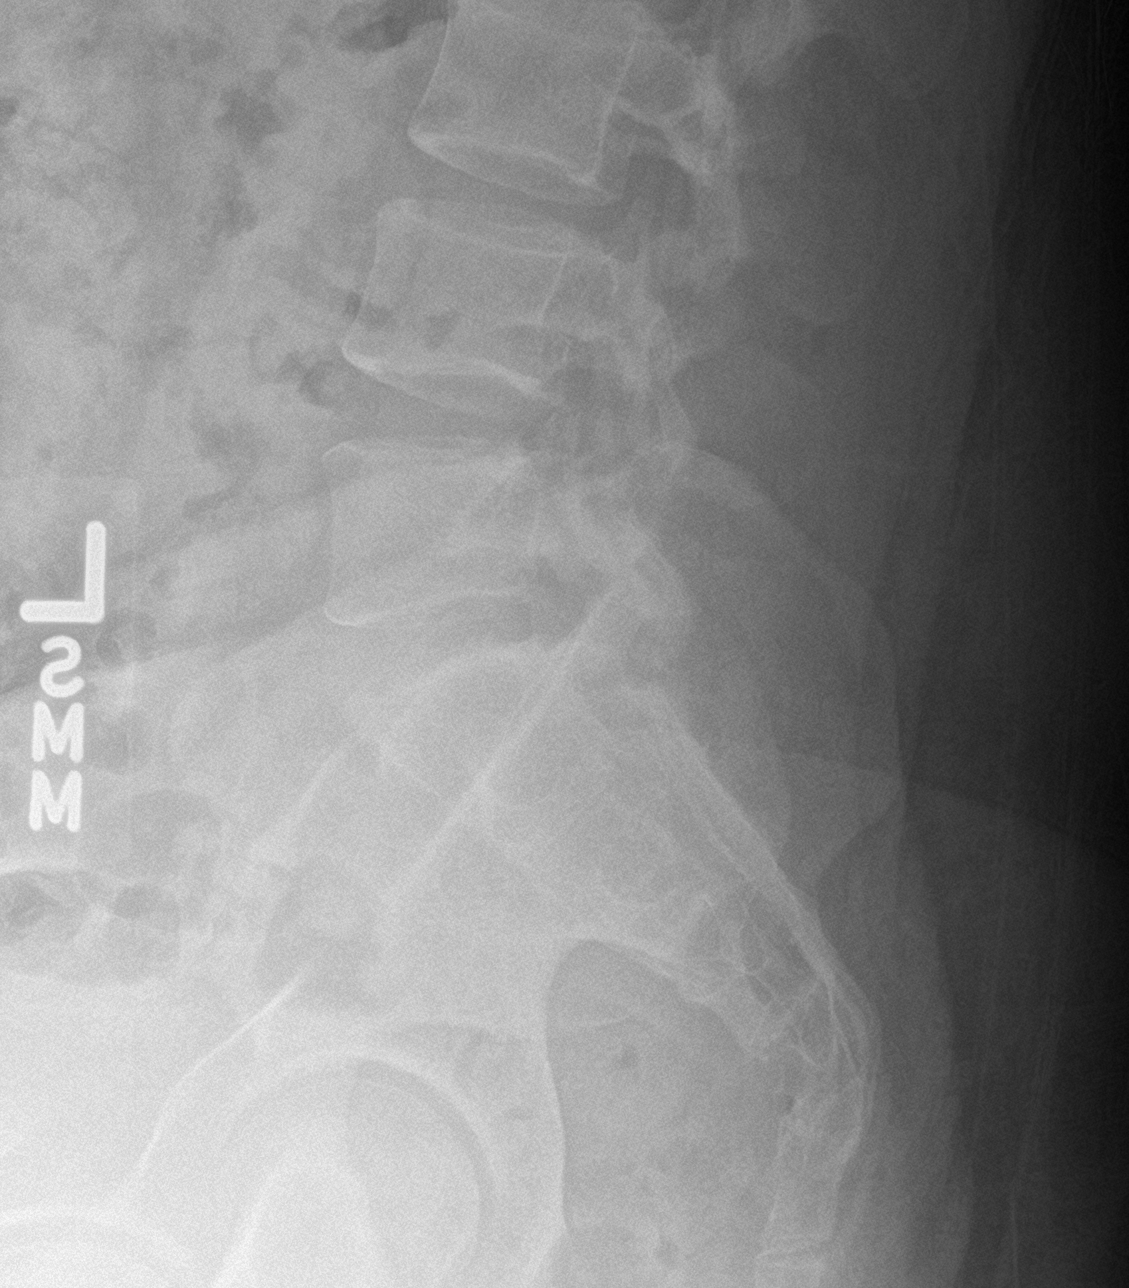

[5 of 5 positions shown; findings below may reference images not displayed]

FINDINGS: There is no evidence of lumbar spine fracture. Alignment is normal.
Intervertebral disc spaces are maintained.
IMPRESSION: Negative.

## 2020-09-06 DIAGNOSIS — Z1159 Encounter for screening for other viral diseases: Secondary | ICD-10-CM | POA: Diagnosis not present

## 2020-09-06 DIAGNOSIS — Z114 Encounter for screening for human immunodeficiency virus [HIV]: Secondary | ICD-10-CM | POA: Diagnosis not present

## 2020-09-06 DIAGNOSIS — Z0000 Encounter for general adult medical examination without abnormal findings: Secondary | ICD-10-CM | POA: Diagnosis not present

## 2020-09-06 DIAGNOSIS — R5383 Other fatigue: Secondary | ICD-10-CM | POA: Diagnosis not present

## 2020-09-06 DIAGNOSIS — Z1339 Encounter for screening examination for other mental health and behavioral disorders: Secondary | ICD-10-CM | POA: Diagnosis not present

## 2020-09-06 DIAGNOSIS — E559 Vitamin D deficiency, unspecified: Secondary | ICD-10-CM | POA: Diagnosis not present

## 2020-09-06 DIAGNOSIS — R0602 Shortness of breath: Secondary | ICD-10-CM | POA: Diagnosis not present

## 2020-09-06 DIAGNOSIS — Z7251 High risk heterosexual behavior: Secondary | ICD-10-CM | POA: Diagnosis not present

## 2020-09-13 DIAGNOSIS — B009 Herpesviral infection, unspecified: Secondary | ICD-10-CM | POA: Diagnosis not present

## 2020-09-13 DIAGNOSIS — E789 Disorder of lipoprotein metabolism, unspecified: Secondary | ICD-10-CM | POA: Diagnosis not present

## 2020-09-13 DIAGNOSIS — F32A Depression, unspecified: Secondary | ICD-10-CM | POA: Diagnosis not present

## 2020-09-13 DIAGNOSIS — Z6831 Body mass index (BMI) 31.0-31.9, adult: Secondary | ICD-10-CM | POA: Diagnosis not present

## 2020-10-27 ENCOUNTER — Emergency Department (HOSPITAL_COMMUNITY)
Admission: EM | Admit: 2020-10-27 | Discharge: 2020-10-28 | Discharge status: Against Medical Advice | Payer: BC Managed Care – PPO

## 2020-10-27 NOTE — ED Notes (Signed)
Pt called for triage x3, no response.  

## 2021-03-23 ENCOUNTER — Other Ambulatory Visit: Payer: Self-pay

## 2021-03-24 ENCOUNTER — Ambulatory Visit (INDEPENDENT_AMBULATORY_CARE_PROVIDER_SITE_OTHER): Payer: BC Managed Care – PPO | Admitting: Family Medicine

## 2021-03-24 ENCOUNTER — Encounter: Payer: Self-pay | Admitting: Family Medicine

## 2021-03-24 DIAGNOSIS — F321 Major depressive disorder, single episode, moderate: Secondary | ICD-10-CM | POA: Diagnosis not present

## 2021-03-24 MED ORDER — CITALOPRAM HYDROBROMIDE 20 MG PO TABS: 20.0000 mg | ORAL_TABLET | Freq: Every day | ORAL | 3 refills | Status: DC

## 2021-03-24 NOTE — Progress Notes (Signed)
[facility] [address] [place] [address] Dept: [phone] Dept Fax: [phone]  Chronic Care Office Visit  Subjective:    Patient ID: Kevin Lewis, male    DOB: Jul 02, 1995, 25 y.o..   MRN: [id number]  Chief Complaint  Patient presents with   Medication Refill    Refill/follow up on medications. No concerns.     History of Present Illness:  Patient is in today for reassessment of chronic medical issues.  Kevin Lewis has a history of moderate depression with anxiety. He has been on maintenance therapy with Celexa. He notes he ran out 2 days ago, but is not having significant withdrawal. He feels the medicaiton works well for him in maintaining his mood. At one point, when he went off of his meds, he noted a gradual return of symptoms.  Past Medical History: Patient Active Problem List   Diagnosis Date Noted   Depression, major, single episode, complete remission (HCC) 06/26/2018   Past Surgical History:  Procedure Laterality Date   ELBOW CLOSED REDUCTION W/ PERCUANEOUS PINNING     Family History  Problem Relation Age of Onset   Diabetes Mother    Diabetes Father    Outpatient Medications Prior to Visit  Medication Sig Dispense Refill   citalopram (CELEXA) 20 MG tablet Take 1 tablet (20 mg total) by mouth daily. 90 tablet 3   No facility-administered medications prior to visit.   Allergies  Allergen Reactions   Penicillins     Objective:   Today's Vitals   03/24/21 0841  BP: 110/66  Pulse: (!) 52  Temp: (!) 97.3 F (36.3 C)  TempSrc: Temporal  SpO2: 99%  Weight: 216 lb 3.2 oz (98.1 kg)  Height: 6' (1.829 m)   Body mass index is 29.32 kg/m.   General: Well developed, well nourished. No acute distress. Psych: Alert and oriented. Normal mood and affect.  Health Maintenance Due  Topic Date Due   HPV VACCINES (1 - Male 2-dose series) Never done   HIV Screening  Never done   Hepatitis C Screening   Never done   TETANUS/TDAP  Never done   COVID-19 Vaccine (3 - Booster for Pfizer series) 09/02/2019   Depression screen [facility] 2/9 03/24/2021 03/24/2021 10/22/2019  Decreased Interest 1 0 1  Down, Depressed, Hopeless 1 0 1  PHQ - 2 Score 2 0 2  Altered sleeping 2 - 2  Tired, decreased energy 2 - 3  Change in appetite 0 - 2  Feeling bad or failure about yourself  0 - 1  Trouble concentrating 1 - 2  Moving slowly or fidgety/restless 0 - 0  Suicidal thoughts 0 - 1  PHQ-9 Score 7 - 13  Difficult doing work/chores Somewhat difficult - Somewhat difficult   GAD 7 : Generalized Anxiety Score 03/24/2021 10/22/2019 06/26/2018  Nervous, Anxious, on Edge 1 1 0  Control/stop worrying 1 0 1  Worry too much - different things 1 0 1  Trouble relaxing 2 2 2   Restless 2 1 0  Easily annoyed or irritable 3 2 1   Afraid - awful might happen 1 1 1   Total GAD 7 Score 11 7 6   Anxiety Difficulty Somewhat difficult Somewhat difficult -     Assessment & Plan:   1. Moderate major depression (HCC) PHQ 9 score is improved. Patient is pleased with effect of the medication. I will continue this. He will follow-up as scheduled to establish care.  - citalopram (CELEXA) 20 MG  tablet; Take 1 tablet (20 mg total) by mouth daily.  Dispense: 90 tablet; Refill: 3  Loyola Mast, MD

## 2021-05-27 ENCOUNTER — Other Ambulatory Visit: Payer: Self-pay

## 2021-05-30 ENCOUNTER — Other Ambulatory Visit: Payer: Self-pay

## 2021-05-30 ENCOUNTER — Ambulatory Visit (INDEPENDENT_AMBULATORY_CARE_PROVIDER_SITE_OTHER): Payer: BC Managed Care – PPO | Admitting: Family Medicine

## 2021-05-30 ENCOUNTER — Encounter: Payer: Self-pay | Admitting: Family Medicine

## 2021-05-30 VITALS — BP 118/74 | HR 70 | Temp 97.8°F | Ht 72.0 in | Wt 206.4 lb

## 2021-05-30 DIAGNOSIS — Z13220 Encounter for screening for lipoid disorders: Secondary | ICD-10-CM | POA: Diagnosis not present

## 2021-05-30 DIAGNOSIS — F321 Major depressive disorder, single episode, moderate: Secondary | ICD-10-CM | POA: Diagnosis not present

## 2021-05-30 DIAGNOSIS — Z23 Encounter for immunization: Secondary | ICD-10-CM

## 2021-05-30 DIAGNOSIS — F1290 Cannabis use, unspecified, uncomplicated: Secondary | ICD-10-CM | POA: Insufficient documentation

## 2021-05-30 MED ORDER — CITALOPRAM HYDROBROMIDE 20 MG PO TABS: 20.0000 mg | ORAL_TABLET | Freq: Every day | ORAL | 3 refills | Status: DC

## 2021-05-30 NOTE — Progress Notes (Signed)
[facility] [address] [place] [address] Dept: [phone] Dept Fax: [phone]  Transfer of Care Office Visit  Subjective:    Patient ID: Kevin Lewis, male    DOB: Aug 03, 1995, 26 y.o..   MRN: [id number]  Chief Complaint  Patient presents with   Establish Care    [facility]-  establish care. No concerns.       History of Present Illness:  Patient is in today to establish care. Kevin Lewis was born in [place], [place]. He attended [employer] for 2 years, but did not complete his degree. He was majoring in classical music and played the [place]. He now is a Freight forwarder at [employer]. He has been married for a little more than a year. He has no children. Kevin Lewis vapes nicotine daily and smokes marijuana about 2-3 times a month. He drinks socially.  Kevin Lewis has a history of major depression with anxiety features. He is managed long-term on citalopram and feel this is effective. He tried counseling in the past, but notes he had difficulty opening up to a therapist.  Kevin Lewis has a history of obesity. He has lost ~ 55 lbs in the past 18 months. He notes he primarily accomplished this by reducing his work hours and making dietary changes. He is not routinely engaged in physical activity, but feels he is busy enough on his job to make up for this.  Past Medical History: Patient Active Problem List   Diagnosis Date Noted   Depression, major, single episode, complete remission ([place]) 06/26/2018   Past Surgical History:  Procedure Laterality Date   ELBOW CLOSED REDUCTION W/ PERCUANEOUS PINNING     Family History  Problem Relation Age of Onset   Hypertension Mother    Diabetes Mother    Hypertension Father    Diabetes Father    Cancer Maternal Uncle        Throat   Cancer Maternal Grandmother        Lung   Cancer Paternal Grandfather        Lung   Outpatient Medications Prior to Visit  Medication Sig Dispense Refill   citalopram  (CELEXA) 20 MG tablet Take 1 tablet (20 mg total) by mouth daily. 90 tablet 3   No facility-administered medications prior to visit.   Allergies  Allergen Reactions   Penicillins     Objective:   Today's Vitals   05/30/21 1306  BP: 118/74  Pulse: 70  Temp: 97.8 F (36.6 C)  TempSrc: Temporal  SpO2: 98%  Weight: 206 lb 6.4 oz (93.6 kg)  Height: 6' (1.829 m)   Body mass index is 27.99 kg/m.   General: Well developed, well nourished. No acute distress. Psych: Alert and oriented. Normal mood and affect.  Health Maintenance Due  Topic Date Due   HPV VACCINES (1 - Male 2-dose series) Never done   HIV Screening  Never done   Hepatitis C Screening  Never done   TETANUS/TDAP  09/24/2018   COVID-19 Vaccine (4 - Booster for Pfizer series) 04/19/2020     Depression screen [facility] 2/9 05/30/2021 03/24/2021 03/24/2021  Decreased Interest 1 1 0  Down, Depressed, Hopeless 0 1 0  PHQ - 2 Score 1 2 0  Altered sleeping 0 2 -  Tired, decreased energy 1 2 -  Change in appetite 0 0 -  Feeling bad or failure about yourself  1 0 -  Trouble concentrating 0 1 -  Moving slowly or fidgety/restless 0 0 -  Suicidal thoughts 0 0 -  PHQ-9 Score 3 7 -  Difficult doing work/chores Not difficult at all Somewhat difficult -   GAD 7 : Generalized Anxiety Score 05/30/2021 03/24/2021 10/22/2019 06/26/2018  Nervous, Anxious, on Edge 1 1 1  0  Control/stop worrying 1 1 0 1  Worry too much - different things 1 1 0 1  Trouble relaxing 1 2 2 2   Restless 1 2 1  0  Easily annoyed or irritable 1 3 2 1   Afraid - awful might happen 0 1 1 1   Total GAD 7 Score 6 11 7 6   Anxiety Difficulty Somewhat difficult Somewhat difficult Somewhat difficult -   Assessment & Plan:   1. Moderate major depression (HCC) Stable on citalopram.  - citalopram (CELEXA) 20 MG tablet; Take 1 tablet (20 mg total) by mouth daily.  Dispense: 90 tablet; Refill: 3  2. Screening for lipid disorders  - Lipid panel; Future  3. Need for  Td vaccine  - Td : Tetanus/diphtheria >7yo Preservative  free   Return in about 6 months (around 11/27/2021) for Reassessment.   Haydee Salter, MD

## 2021-06-07 ENCOUNTER — Other Ambulatory Visit (INDEPENDENT_AMBULATORY_CARE_PROVIDER_SITE_OTHER): Payer: BC Managed Care – PPO

## 2021-06-07 ENCOUNTER — Other Ambulatory Visit: Payer: Self-pay

## 2021-06-07 DIAGNOSIS — Z13220 Encounter for screening for lipoid disorders: Secondary | ICD-10-CM

## 2021-06-07 LAB — LIPID PANEL
Cholesterol: 176 mg/dL (ref 0–200)
HDL: 29.4 mg/dL — ABNORMAL LOW (ref >39.00)
LDL Cholesterol: 118 mg/dL — ABNORMAL HIGH (ref 0–99)
NonHDL: 146.23
Total CHOL/HDL Ratio: 6
Triglycerides: 139 mg/dL (ref 0.0–149.0)
VLDL: 27.8 mg/dL (ref 0.0–40.0)

## 2021-10-30 ENCOUNTER — Ambulatory Visit
Admission: RE | Admit: 2021-10-30 | Discharge: 2021-10-30 | Disposition: A | Discharge status: Home / Self Care | Payer: BC Managed Care – PPO | Source: Ambulatory Visit | Attending: Emergency Medicine | Admitting: Emergency Medicine

## 2021-10-30 ENCOUNTER — Ambulatory Visit (INDEPENDENT_AMBULATORY_CARE_PROVIDER_SITE_OTHER): Payer: BC Managed Care – PPO

## 2021-10-30 VITALS — BP 126/86 | HR 82 | Temp 98.6°F | Resp 16

## 2021-10-30 DIAGNOSIS — S6992XA Unspecified injury of left wrist, hand and finger(s), initial encounter: Secondary | ICD-10-CM

## 2021-10-30 DIAGNOSIS — M79642 Pain in left hand: Secondary | ICD-10-CM | POA: Diagnosis not present

## 2021-10-30 DIAGNOSIS — S62142A Displaced fracture of body of hamate [unciform] bone, left wrist, initial encounter for closed fracture: Secondary | ICD-10-CM

## 2021-10-30 NOTE — ED Triage Notes (Signed)
Pt states left hand pain for the past 5 days after punching a table.  Left hand swollen,bruised.

## 2021-10-30 NOTE — ED Provider Notes (Addendum)
[facility]    CSN: [id number] Arrival date & time: 10/30/21  1355    HISTORY   Chief Complaint  Patient presents with   Hand Problem    Suspected broken bone or sprain in the hand - Entered by patient   HPI Kevin Lewis is a pleasant, 26 y.o. male who presents to urgent care today. Patient complains of bruising and swelling in his left hand after punching a table 5 days ago.  Patient said he was very angry at some things that were going on at work and it was an impulsive move.  Patient states that he has not attempted to wrap or ameliorate his pain and swelling.  Patient states he is left-handed.  The history is provided by the patient.   Past Medical History:  Diagnosis Date   Anxiety    Depression    Patient Active Problem List   Diagnosis Date Noted   Marijuana use 05/30/2021   Moderate major depression (HCC) 05/30/2021   Past Surgical History:  Procedure Laterality Date   ELBOW CLOSED REDUCTION W/ PERCUANEOUS PINNING      Home Medications    Prior to Admission medications   Medication Sig Start Date End Date Taking? Authorizing Provider  citalopram (CELEXA) 20 MG tablet Take 1 tablet (20 mg total) by mouth daily. 05/30/21   Loyola Mast, MD    Family History Family History  Problem Relation Age of Onset   Hypertension Mother    Diabetes Mother    Hypertension Father    Diabetes Father    Cancer Maternal Uncle        Throat   Cancer Maternal Grandmother        Lung   Cancer Paternal Grandfather        Lung   Social History Social History   Tobacco Use   Smoking status: Never   Smokeless tobacco: Never  Vaping Use   Vaping Use: Every day  Substance Use Topics   Alcohol use: Yes    Comment: socially   Drug use: No   Allergies   Penicillins  Review of Systems Review of Systems Pertinent findings revealed after performing a 14 point review of systems has been noted in the history of present illness.  Physical Exam Triage Vital  Signs ED Triage Vitals  Enc Vitals Group     BP 01/28/21 0827 (!) 147/82     Pulse Rate 01/28/21 0827 72     Resp 01/28/21 0827 18     Temp 01/28/21 0827 98.3 F (36.8 C)     Temp Source 01/28/21 0827 Oral     SpO2 01/28/21 0827 98 %     Weight --      Height --      Head Circumference --      Peak Flow --      Pain Score 01/28/21 0826 5     Pain Loc --      Pain Edu? --      Excl. in GC? --    Updated Vital Signs BP 126/86 (BP Location: Right Arm)   Pulse 82   Temp 98.6 F (37 C) (Oral)   Resp 16   SpO2 98%   Physical Exam Vitals and nursing note reviewed.  Constitutional:      General: He is not in acute distress.    Appearance: Normal appearance. He is normal weight. He is not ill-appearing.  HENT:     Head: Normocephalic and atraumatic.  Eyes:     Extraocular Movements: Extraocular movements intact.     Conjunctiva/sclera: Conjunctivae normal.     Pupils: Pupils are equal, round, and reactive to light.  Cardiovascular:     Rate and Rhythm: Normal rate and regular rhythm.  Pulmonary:     Effort: Pulmonary effort is normal.     Breath sounds: Normal breath sounds.  Musculoskeletal:     Right wrist: Normal.     Left wrist: Normal.     Right hand: Normal.     Left hand: Swelling, tenderness and bony tenderness present. No deformity or lacerations. Decreased range of motion. Normal strength. Normal sensation. There is no disruption of two-point discrimination. Normal capillary refill. Normal pulse.     Cervical back: Normal range of motion and neck supple.     Comments: ++ ecchymoses on back of left hand  Skin:    General: Skin is warm and dry.  Neurological:     General: No focal deficit present.     Mental Status: He is alert and oriented to person, place, and time. Mental status is at baseline.  Psychiatric:        Mood and Affect: Mood normal.        Behavior: Behavior normal.        Thought Content: Thought content normal.        Judgment: Judgment  normal.     [facility] / Diagnostics / Procedures:     Radiology DG Hand Complete Left  Result Date: 10/30/2021 CLINICAL DATA:  Acute LEFT hand pain following injury 5 days ago. Initial encounter. EXAM: LEFT HAND - COMPLETE 3+ VIEW COMPARISON:  None Available. FINDINGS: There is irregularity/lucency along the hamate bone on one view and may represent a hamate fracture. No other acute fracture, subluxation or dislocation identified. No focal bony lesions are present. IMPRESSION: Possible nondisplaced hamate fracture.  Correlate with pain. Electronically Signed   By: Harmon Pier M.D.   On: 10/30/2021 15:02    Procedures Procedures (including critical care time) EKG  Pending results:  Labs Reviewed - No data to display  Medications Ordered in UC: Medications - No data to display  UC Diagnoses / Final Clinical Impressions(s)   I have reviewed the triage vital signs and the nursing notes.  Pertinent labs & imaging results that were available during my care of the patient were reviewed by me and considered in my medical decision making (see chart for details).    Final diagnoses:  Hand injury, left, initial encounter  Closed displaced fracture of body of hamate of left wrist, initial encounter   Patient placed in Ace wrap and advised to follow-up with [facility] tomorrow morning further evaluation and treatment of possible fracture of his hamate bone on his left hand.  Note provided for work.  Patient advised he can take ibuprofen for pain as needed.  ED Prescriptions   None    PDMP not reviewed this encounter.  Discharge Instructions:   Discharge Instructions      The x-ray of your left hand shows that you have fractured one of the bones in your hand called the hamate.  We have placed you in an Ace wrap partly for stability importantly to remind you not to use your left hand.  I have enclosed some patient education about this type of fracture that I hope you find  helpful.  You are welcome to take ibuprofen 600 mg 3 times daily for pain.  I recommend that  you follow-up with orthopedics tomorrow morning for further evaluation and treatment which may require a cast to ensure that the bone heals properly.  Improper healing of bones, particularly bones in the hand and feet, can cause long-term pain and joint dysfunction.  Thank you for visiting urgent care today.        Disposition Upon Discharge:  Condition: stable for discharge home Home: take medications as prescribed; routine discharge instructions as discussed; follow up as advised.  Patient presented with an acute illness with associated systemic symptoms and significant discomfort requiring urgent management. In my opinion, this is a condition that a prudent lay person (someone who possesses an average knowledge of health and medicine) may potentially expect to result in complications if not addressed urgently such as respiratory distress, impairment of bodily function or dysfunction of bodily organs.   Routine symptom specific, illness specific and/or disease specific instructions were discussed with the patient and/or caregiver at length.   As such, the patient has been evaluated and assessed, work-up was performed and treatment was provided in alignment with urgent care protocols and evidence based medicine.  Patient/parent/caregiver has been advised that the patient may require follow up for further testing and treatment if the symptoms continue in spite of treatment, as clinically indicated and appropriate.  Patient/parent/caregiver has been advised to report to orthopedic urgent care clinic or return to the [facility] or PCP in 3-5 days if no better; follow-up with orthopedics, PCP or the Emergency Department if new signs and symptoms develop or if the current signs or symptoms continue to change or worsen for further workup, evaluation and treatment as clinically indicated and appropriate  The  patient will follow up with their current PCP if and as advised. If the patient does not currently have a PCP we will have assisted them in obtaining one.   The patient may need specialty follow up if the symptoms continue, in spite of conservative treatment and management, for further workup, evaluation, consultation and treatment as clinically indicated and appropriate.  Patient/parent/caregiver verbalized understanding and agreement of plan as discussed.  All questions were addressed during visit.  Please see discharge instructions below for further details of plan.  This office note has been dictated using Teaching laboratory technician.  Unfortunately, this method of dictation can sometimes lead to typographical or grammatical errors.  I apologize for your inconvenience in advance if this occurs.  Please do not hesitate to reach out to me if clarification is needed.      Theadora Rama Scales, PA-C 10/30/21 1516    Theadora Rama Scales, PA-C 10/30/21 [phone]

## 2021-10-30 NOTE — Discharge Instructions (Addendum)
The x-ray of your left hand shows that you have fractured one of the bones in your hand called the hamate.  We have placed you in an Ace wrap partly for stability importantly to remind you not to use your left hand.  I have enclosed some patient education about this type of fracture that I hope you find helpful.  You are welcome to take ibuprofen 600 mg 3 times daily for pain.  I recommend that you follow-up with orthopedics tomorrow morning for further evaluation and treatment which may require a cast to ensure that the bone heals properly.  Improper healing of bones, particularly bones in the hand and feet, can cause long-term pain and joint dysfunction.  Thank you for visiting urgent care today.

## 2021-10-31 DIAGNOSIS — M79642 Pain in left hand: Secondary | ICD-10-CM | POA: Diagnosis not present

## 2021-11-07 DIAGNOSIS — M79642 Pain in left hand: Secondary | ICD-10-CM | POA: Diagnosis not present

## 2021-12-06 ENCOUNTER — Ambulatory Visit: Payer: BC Managed Care – PPO | Admitting: Family Medicine

## 2022-04-05 ENCOUNTER — Ambulatory Visit: Payer: BC Managed Care – PPO | Admitting: Family Medicine

## 2022-04-05 ENCOUNTER — Telehealth: Payer: Self-pay | Admitting: Family Medicine

## 2022-04-05 NOTE — Telephone Encounter (Signed)
1.3.2024 no show letter sent 

## 2022-04-07 NOTE — Telephone Encounter (Signed)
1st no show, fee waived, letter sent 

## 2022-04-10 ENCOUNTER — Encounter: Payer: Self-pay | Admitting: Family Medicine

## 2022-04-10 ENCOUNTER — Ambulatory Visit (INDEPENDENT_AMBULATORY_CARE_PROVIDER_SITE_OTHER): Payer: BC Managed Care – PPO | Admitting: Family Medicine

## 2022-04-10 VITALS — BP 118/70 | HR 60 | Temp 97.6°F | Ht 72.0 in | Wt 188.8 lb

## 2022-04-10 DIAGNOSIS — Z711 Person with feared health complaint in whom no diagnosis is made: Secondary | ICD-10-CM | POA: Diagnosis not present

## 2022-04-10 DIAGNOSIS — F321 Major depressive disorder, single episode, moderate: Secondary | ICD-10-CM

## 2022-04-10 NOTE — Progress Notes (Signed)
[facility] [address] [place] [address] Dept: [phone] Dept Fax: [phone]  Office Visit  Subjective:    Patient ID: Kevin Lewis, male    DOB: [birth year], 27 y.o..   MRN: [id number]  Chief Complaint  Patient presents with   Acute Visit    C/o having a lump on his testicle x 2 weeks.      History of Present Illness:  Patient is in today with a concern for a lump in his left testicle. He notes he found this in the shower. He had been checking it daily. Over the past 2 days, he notes he can no longer feel this. It was not painful. He has not had any penile discharge.  Mr. Demartini has a history of major depression with anxiety features. He is managed long-term on citalopram 20 mg daily. He has had some more anxiety symptoms lately. 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 He describes one day, where he broke down crying  with a sense of feeling overwhelmed. He feels this will get better as he gets settled in more to his job.  Past Medical History: Patient Active Problem List   Diagnosis Date Noted   Marijuana use 05/30/2021   Moderate major depression ([place]) 05/30/2021   Past Surgical History:  Procedure Laterality Date   ELBOW CLOSED REDUCTION W/ PERCUANEOUS PINNING     Family History  Problem Relation Age of Onset   Hypertension Mother    Diabetes Mother    Hypertension Father    Diabetes Father    Cancer Maternal Uncle        Throat   Cancer Maternal Grandmother        Lung   Cancer Paternal Grandfather        Lung   Outpatient Medications Prior to Visit  Medication Sig Dispense Refill   citalopram (CELEXA) 20 MG tablet Take 1 tablet (20 mg total) by mouth daily. 90 tablet 3   No facility-administered medications prior to visit.   Allergies  Allergen Reactions   Penicillins     Objective:   Today's Vitals   04/10/22 0902  BP: 118/70  Pulse: 60  Temp: 97.6 F  (36.4 C)  TempSrc: Temporal  SpO2: 97%  Weight: 188 lb 12.8 oz (85.6 kg)  Height: 6' (1.829 m)   Body mass index is 25.61 kg/m.   General: Well developed, well nourished. No acute distress. GU: Normal circumcised male. Testicles are of normal size and consistent. No masses noted. Psych: Alert and oriented. Normal mood and affect.  Health Maintenance Due  Topic Date Due   HPV VACCINES (1 - Male 2-dose series) Never done   HIV Screening  Never done   Hepatitis C Screening  Never done   INFLUENZA VACCINE  11/01/2021      04/10/2022    9:49 AM 05/30/2021    1:30 PM 03/24/2021    8:57 AM  Depression screen PHQ 2/9  Decreased Interest 1 1 1   Down, Depressed, Hopeless 1 0 1  PHQ - 2 Score 2 1 2   Altered sleeping 0 0 2  Tired, decreased energy 2 1 2   Change in appetite 0 0 0  Feeling bad or failure about yourself  1 1 0  Trouble concentrating 0 0 1  Moving slowly or fidgety/restless 0 0 0  Suicidal thoughts 1 0 0  PHQ-9 Score 6 3 7   Difficult doing work/chores  Somewhat difficult Not difficult at all Somewhat difficult      04/10/2022    9:49 AM 05/30/2021    1:30 PM 03/24/2021    8:57 AM 10/22/2019   10:57 AM  GAD 7 : Generalized Anxiety Score  Nervous, Anxious, on Edge 2 1 1 1   Control/stop worrying 2 1 1  0  Worry too much - different things 1 1 1  0  Trouble relaxing 1 1 2 2   Restless 1 1 2 1   Easily annoyed or irritable 1 1 3 2   Afraid - awful might happen 2 0 1 1  Total GAD 7 Score 10 6 11 7   Anxiety Difficulty Somewhat difficult Somewhat difficult Somewhat difficult Somewhat difficult    Assessment & Plan:   1. Feared complaint without diagnosis At this point, the testicular exam is normal. I recommend we observe this for now, watching for any sing of recurrence.  2. Moderate major depression (HCC) The change in jobs appears to have triggered a mild increase in symptoms. I agree that this should improve as he settles into the new job. I would have him continue his  citalopram 20 mg daily.   Return in about 6 months (around 10/09/2022) for Reassessment.   , MD

## 2022-06-23 ENCOUNTER — Other Ambulatory Visit: Payer: Self-pay | Admitting: Family Medicine

## 2022-06-23 DIAGNOSIS — F321 Major depressive disorder, single episode, moderate: Secondary | ICD-10-CM

## 2022-09-29 ENCOUNTER — Other Ambulatory Visit: Payer: Self-pay | Admitting: Family Medicine

## 2022-09-29 DIAGNOSIS — F321 Major depressive disorder, single episode, moderate: Secondary | ICD-10-CM

## 2023-01-05 ENCOUNTER — Other Ambulatory Visit: Payer: Self-pay | Admitting: Family Medicine

## 2023-01-05 DIAGNOSIS — F321 Major depressive disorder, single episode, moderate: Secondary | ICD-10-CM

## 2023-04-13 ENCOUNTER — Other Ambulatory Visit: Payer: Self-pay | Admitting: Family Medicine

## 2023-04-13 DIAGNOSIS — F321 Major depressive disorder, single episode, moderate: Secondary | ICD-10-CM

## 2023-04-13 NOTE — Telephone Encounter (Signed)
Lft VM to rtn call to schedule an appt. Dm/cma

## 2023-04-16 NOTE — Telephone Encounter (Signed)
Lft VM to rtn call to schedule an appt. Dm/cma

## 2023-04-23 NOTE — Telephone Encounter (Signed)
Lft VM to rtn call to schedule an appt.  Mailed letter to call us back due to not being able to reach by phone. Dm/cma
